# Patient Record
Sex: Female | Born: 1985 | Hispanic: No | Marital: Single | Smoking: Never smoker
Health system: Southern US, Community
[De-identification: ages and names within clinical notes are randomized; demographics above are authoritative.]

---

## 2019-09-10 NOTE — L&D Delivery Note (Addendum)
Roshunda Keir is a 34 y.o. female G3P2002 with IUP at [redacted]w[redacted]d admitted for IUFD.  She progressed with AROM and pitocin augmentation to complete and pushed 3.5 hours to deliver.    Delivery Note At 8:15 PM a stillborn female was delivered via Vaginal, Spontaneous (Presentation: ROA ).  APGAR: 0, 0; weight pending.   Placenta status: Spontaneous, Intact.  Cord: 3 vessels with the following complications: Velamentous.  Long slow delivery of head. With delivery of head, infant noted to be macrosomic and very swollen. Nuchal cord reduced. CNM unable to delivery shoulders and no movement of infant with maternal pushing effort. Dr. Earlene Plater called to bedside to assume care.   Anesthesia: Epidural Episiotomy: None Lacerations: 2nd degree Suture Repair: 3.0 vicryl Est. Blood Loss (mL):  200  Mom to postpartum.  Baby to [place].  Rolm Bookbinder CNM 02/13/2020, 8:40 PM   Called to bedside to assist with delivery for IUFD. On my arrival, fetal head delivered but CNM unable to deliver body. Fetal position OP. I was able to palpate the right axilla. I hooked a finger in the right axilla and tugged, was able to sweep arm caudally for delivery and in the process, shearing of the skin and dislocation of the fetal shoulder noted. This did not effect delivery of the infant, however, I was now able to palpate the left fetal axilla and performed the same maneuver, also shearing the fetal skin and dislocating the fetal axilla. With delivery of both shoulders, I was able to put pressure on the fetal trunk to slowly effect delivery. Once chest/abdomen were delivered, remainder of fetus delivered easily with a gush of dark red blood.This process took several minutes due to significant fetal size. Infant handed to waiting CNM, who clamped cord and delivered placenta.    Baldemar Lenis, M.D. Attending [facility] for [employer] [facility])

## 2020-02-02 ENCOUNTER — Inpatient Hospital Stay (HOSPITAL_COMMUNITY)
Admission: AD | Admit: 2020-02-02 | Discharge: 2020-02-03 | Disposition: A | Discharge status: Home / Self Care | Payer: Medicaid Other | Attending: Obstetrics & Gynecology | Admitting: Obstetrics & Gynecology

## 2020-02-02 ENCOUNTER — Other Ambulatory Visit: Payer: Self-pay

## 2020-02-02 ENCOUNTER — Inpatient Hospital Stay (HOSPITAL_BASED_OUTPATIENT_CLINIC_OR_DEPARTMENT_OTHER): Payer: Medicaid Other

## 2020-02-02 ENCOUNTER — Encounter (HOSPITAL_COMMUNITY): Payer: Self-pay | Admitting: Obstetrics & Gynecology

## 2020-02-02 DIAGNOSIS — Z3A35 35 weeks gestation of pregnancy: Secondary | ICD-10-CM

## 2020-02-02 DIAGNOSIS — O1493 Unspecified pre-eclampsia, third trimester: Secondary | ICD-10-CM

## 2020-02-02 DIAGNOSIS — O4703 False labor before 37 completed weeks of gestation, third trimester: Secondary | ICD-10-CM | POA: Insufficient documentation

## 2020-02-02 DIAGNOSIS — O288 Other abnormal findings on antenatal screening of mother: Secondary | ICD-10-CM | POA: Diagnosis not present

## 2020-02-02 LAB — DIFFERENTIAL
Abs Immature Granulocytes: 0.09 10*3/uL — ABNORMAL HIGH (ref 0.00–0.07)
Basophils Absolute: 0 10*3/uL (ref 0.0–0.1)
Basophils Relative: 0 %
Eosinophils Absolute: 0.2 10*3/uL (ref 0.0–0.5)
Eosinophils Relative: 2 %
Immature Granulocytes: 1 %
Lymphocytes Relative: 27 %
Lymphs Abs: 2.5 10*3/uL (ref 0.7–4.0)
Monocytes Absolute: 0.7 10*3/uL (ref 0.1–1.0)
Monocytes Relative: 7 %
Neutro Abs: 6.1 10*3/uL (ref 1.7–7.7)
Neutrophils Relative %: 63 %

## 2020-02-02 LAB — CBC
HCT: 37.2 % (ref 36.0–46.0)
Hemoglobin: 12.3 g/dL (ref 12.0–15.0)
MCH: 31.9 pg (ref 26.0–34.0)
MCHC: 33.1 g/dL (ref 30.0–36.0)
MCV: 96.6 fL (ref 80.0–100.0)
Platelets: 257 10*3/uL (ref 150–400)
RBC: 3.85 MIL/uL — ABNORMAL LOW (ref 3.87–5.11)
RDW: 13.4 % (ref 11.5–15.5)
WBC: 9.6 10*3/uL (ref 4.0–10.5)
nRBC: 0.5 % — ABNORMAL HIGH (ref 0.0–0.2)

## 2020-02-02 LAB — TYPE AND SCREEN
ABO/RH(D): A POS
Antibody Screen: NEGATIVE

## 2020-02-02 LAB — URINALYSIS, ROUTINE W REFLEX MICROSCOPIC
Bilirubin Urine: NEGATIVE
Glucose, UA: NEGATIVE mg/dL
Ketones, ur: NEGATIVE mg/dL
Nitrite: NEGATIVE
Protein, ur: NEGATIVE mg/dL
Specific Gravity, Urine: 1.005 (ref 1.005–1.030)
pH: 6 (ref 5.0–8.0)

## 2020-02-02 LAB — COMPREHENSIVE METABOLIC PANEL
ALT: 14 U/L (ref 0–44)
AST: 17 U/L (ref 15–41)
Albumin: 2.3 g/dL — ABNORMAL LOW (ref 3.5–5.0)
Alkaline Phosphatase: 80 U/L (ref 38–126)
Anion gap: 10 (ref 5–15)
BUN: 10 mg/dL (ref 6–20)
CO2: 18 mmol/L — ABNORMAL LOW (ref 22–32)
Calcium: 8.7 mg/dL — ABNORMAL LOW (ref 8.9–10.3)
Chloride: 104 mmol/L (ref 98–111)
Creatinine, Ser: 0.64 mg/dL (ref 0.44–1.00)
GFR calc Af Amer: >60 mL/min (ref >60)
GFR calc non Af Amer: >60 mL/min (ref >60)
Glucose, Bld: 159 mg/dL — ABNORMAL HIGH (ref 70–99)
Potassium: 4.1 mmol/L (ref 3.5–5.1)
Sodium: 132 mmol/L — ABNORMAL LOW (ref 135–145)
Total Bilirubin: 0.3 mg/dL (ref 0.3–1.2)
Total Protein: 6.4 g/dL — ABNORMAL LOW (ref 6.5–8.1)

## 2020-02-02 LAB — ABO/RH: ABO/RH(D): A POS

## 2020-02-02 NOTE — MAU Provider Note (Addendum)
CC:  Chief Complaint  Patient presents with  . Contractions     First Provider Initiated Contact with Patient 02/02/20 2140      HPI: Sonya Caldwell is a 34 y.o. year old G58P2002 female at [redacted]w[redacted]d weeks gestation who presents to MAU initially reporting contractions, but then said it was more pain in her pubic bone.  States she had a few prenatal visits in [place] and that she had an ultrasound in March where they gave her a due date 03/05/2020.  Does not have records and states she will not be able to access records.  Plans to stay and deliver in [place].  Does not have any plans for prenatal care.  Would like assistance with scheduling prenatal care.  Incidental finding of elevated blood pressure upon arrival to MAU.  States she had high blood pressure with her first pregnancy.  States she was not diagnosed with preeclampsia.  Associated Sx: Negative for headache, vision changes, epigastric pain, urinary complaints, GI complaints. Vaginal bleeding: Denies Leaking of fluid: Denies Fetal movement: Normal  O:  Patient Vitals for the past 24 hrs:  BP Temp Pulse Resp SpO2 Weight  02/03/20 0103 134/73 - 71 18 - -  02/03/20 0027 133/86 - 74 - - -  02/02/20 2246 (!) 117/103 - 70 - - -  02/02/20 2231 116/70 - 93 - - -  02/02/20 2218 (!) 130/101 - 66 - - -  02/02/20 2131 120/76 - 78 - - -  02/02/20 2122 (!) 145/64 - 84 - - -  02/02/20 2121 - 97.7 F (36.5 C) - 20 96 % 121.7 kg    General: NAD Heart: Regular rate Lungs: Normal rate and effort Abd: Soft, NT, Gravid, S>D, ~6 LB by Leopold's, possible elevated AFI? Extremities: 1+ pedal edema Pelvic: NEFG, no leaking of fluid or blood .  Tender symphysis pubis.  Dilation: Closed Exam by:: Dorathy Kinsman, CNMClear/thick/ballotable, vertex by Leopold's  EFM: 150, min-Moderate variability, no accelerations, no decelerations Toco: Uterine irritability  MAU course Orders Placed This Encounter  Procedures  . Culture, OB Urine  . [place]  MFM FETAL BPP WO NON STRESS  . Hepatitis B surface antigen  . Rubella screen  . RPR  . CBC  . Differential  . HIV Antibody (routine testing w rflx)  . Comprehensive metabolic panel  . Protein / creatinine ratio, urine  . Urinalysis, Routine w reflex microscopic  . Type and screen  . ABO/Rh  . Discharge patient   Results for orders placed or performed during the hospital encounter of 02/02/20 (from the past 24 hour(s))  CBC     Status: Abnormal   Collection Time: 02/02/20 10:05 PM  Result Value Ref Range   WBC 9.6 4.0 - 10.5 K/uL   RBC 3.85 (L) 3.87 - 5.11 MIL/uL   Hemoglobin 12.3 12.0 - 15.0 g/dL   HCT 62.9 52.8 - 41.3 %   MCV 96.6 80.0 - 100.0 fL   MCH 31.9 26.0 - 34.0 pg   MCHC 33.1 30.0 - 36.0 g/dL   RDW 24.4 01.0 - 27.2 %   Platelets 257 150 - 400 K/uL   nRBC 0.5 (H) 0.0 - 0.2 %  Differential     Status: Abnormal   Collection Time: 02/02/20 10:05 PM  Result Value Ref Range   Neutrophils Relative % 63 %   Neutro Abs 6.1 1.7 - 7.7 K/uL   Lymphocytes Relative 27 %   Lymphs Abs 2.5 0.7 - 4.0 K/uL   Monocytes  Relative 7 %   Monocytes Absolute 0.7 0.1 - 1.0 K/uL   Eosinophils Relative 2 %   Eosinophils Absolute 0.2 0.0 - 0.5 K/uL   Basophils Relative 0 %   Basophils Absolute 0.0 0.0 - 0.1 K/uL   Immature Granulocytes 1 %   Abs Immature Granulocytes 0.09 (H) 0.00 - 0.07 K/uL  Type and screen     Status: None   Collection Time: 02/02/20 10:05 PM  Result Value Ref Range   ABO/RH(D) A POS    Antibody Screen NEG    Sample Expiration      02/05/2020,2359 Performed at [facility], [address] [place], [place] [address]   Comprehensive metabolic panel     Status: Abnormal   Collection Time: 02/02/20 10:05 PM  Result Value Ref Range   Sodium 132 (L) 135 - 145 mmol/L   Potassium 4.1 3.5 - 5.1 mmol/L   Chloride 104 98 - 111 mmol/L   CO2 18 (L) 22 - 32 mmol/L   Glucose, Bld 159 (H) 70 - 99 mg/dL   BUN 10 6 - 20 mg/dL   Creatinine, Ser 3.55 0.44 - 1.00 mg/dL    Calcium 8.7 (L) 8.9 - 10.3 mg/dL   Total Protein 6.4 (L) 6.5 - 8.1 g/dL   Albumin 2.3 (L) 3.5 - 5.0 g/dL   AST 17 15 - 41 U/L   ALT 14 0 - 44 U/L   Alkaline Phosphatase 80 38 - 126 U/L   Total Bilirubin 0.3 0.3 - 1.2 mg/dL   GFR calc non Af Amer >60 >60 mL/min   GFR calc Af Amer >60 >60 mL/min   Anion gap 10 5 - 15  ABO/Rh     Status: None   Collection Time: 02/02/20 10:05 PM  Result Value Ref Range   ABO/RH(D)      A POS Performed at [facility], [address], [place], [place] [address]   Protein / creatinine ratio, urine     Status: Abnormal   Collection Time: 02/02/20 10:40 PM  Result Value Ref Range   Creatinine, Urine 28.64 mg/dL   Total Protein, Urine 12 mg/dL   Protein Creatinine Ratio 0.42 (H) 0.00 - 0.15 mg/mg[Cre]  Urinalysis, Routine w reflex microscopic     Status: Abnormal   Collection Time: 02/02/20 10:54 PM  Result Value Ref Range   Color, Urine STRAW (A) YELLOW   APPearance CLEAR CLEAR   Specific Gravity, Urine 1.005 1.005 - 1.030   pH 6.0 5.0 - 8.0   Glucose, UA NEGATIVE NEGATIVE mg/dL   Hgb urine dipstick MODERATE (A) NEGATIVE   Bilirubin Urine NEGATIVE NEGATIVE   Ketones, ur NEGATIVE NEGATIVE mg/dL   Protein, ur NEGATIVE NEGATIVE mg/dL   Nitrite NEGATIVE NEGATIVE   Leukocytes,Ua TRACE (A) NEGATIVE   RBC / HPF 0-5 0 - 5 RBC/hpf   WBC, UA 0-5 0 - 5 WBC/hpf   Bacteria, UA RARE (A) NONE SEEN   Squamous Epithelial / LPF 11-20 0 - 5   Mucus PRESENT     Care of patient turned over to Judeth Horn, NP at 10 PM.  Katrinka Blazing, [place], CNM 02/02/2020 10:07 PM   Elevated BPs in MAU.  No severe features.  Protein creatinine ratio elevated at 0.91 giving patient diagnosis of preeclampsia.  Patient is preterm, will get betamethasone series.  We will also send a message to the office for prenatal care and antenatal testing.  BPP ordered for nonreactive tracing.  Tracing became reactive  just prior to going to ultrasound.  BPP 8 out of 8, with NST 10 out  of 10.   A:  1. Pre-eclampsia in third trimester   2. Non-reactive NST (non-stress test)   3. [redacted] weeks gestation of pregnancy    P: Discharge home Return to MAU tomorrow for second BMZ Preeclampsia precautions Msg to [facility] for ob appts  Jorje Guild, NP

## 2020-02-02 NOTE — MAU Note (Signed)
Pt reports recently moving to here from [place].   Pt reports last prenatal visit March 17.   Pt reports not getting prenatal care since being in [place].   Denies vaginal bleeding or LOF.   Reports +FM    Pt reports starting to have ctx's at 1200.

## 2020-02-03 ENCOUNTER — Encounter (HOSPITAL_COMMUNITY): Payer: Self-pay | Admitting: *Deleted

## 2020-02-03 ENCOUNTER — Inpatient Hospital Stay (HOSPITAL_COMMUNITY)
Admission: AD | Admit: 2020-02-03 | Discharge: 2020-02-03 | Disposition: A | Discharge status: Home / Self Care | Payer: Medicaid Other | Source: Home / Self Care | Attending: Obstetrics & Gynecology | Admitting: Obstetrics & Gynecology

## 2020-02-03 DIAGNOSIS — Z3A35 35 weeks gestation of pregnancy: Secondary | ICD-10-CM

## 2020-02-03 DIAGNOSIS — O1493 Unspecified pre-eclampsia, third trimester: Secondary | ICD-10-CM

## 2020-02-03 LAB — HIV ANTIBODY (ROUTINE TESTING W REFLEX): HIV Screen 4th Generation wRfx: NONREACTIVE

## 2020-02-03 LAB — HEPATITIS B SURFACE ANTIGEN: Hepatitis B Surface Ag: NONREACTIVE

## 2020-02-03 LAB — PROTEIN / CREATININE RATIO, URINE
Creatinine, Urine: 28.64 mg/dL
Protein Creatinine Ratio: 0.42 mg/mg{Cre} — ABNORMAL HIGH (ref 0.00–0.15)
Total Protein, Urine: 12 mg/dL

## 2020-02-03 LAB — RPR: RPR Ser Ql: NONREACTIVE

## 2020-02-03 MED ORDER — BETAMETHASONE SOD PHOS & ACET 6 (3-3) MG/ML IJ SUSP
12.0000 mg | Freq: Once | INTRAMUSCULAR | Status: AC
  Administered 2020-02-03: 12 mg via INTRAMUSCULAR
  Filled 2020-02-03: qty 5

## 2020-02-03 MED ORDER — BETAMETHASONE SOD PHOS & ACET 6 (3-3) MG/ML IJ SUSP
12.0000 mg | Freq: Once | INTRAMUSCULAR | Status: AC
  Administered 2020-02-03: 12 mg via INTRAMUSCULAR

## 2020-02-03 NOTE — MAU Note (Signed)
Pt here for 2nd BMZ injection. STates swelling has gone down and feels much better today. Denies VB or LOF. Good FM. Steroid injection given at 2100 and pt tol well.

## 2020-02-03 NOTE — Discharge Instructions (Signed)
Hypertension During Pregnancy °High blood pressure (hypertension) is when the force of blood pumping through the arteries is too strong. Arteries are blood vessels that carry blood from the heart throughout the body. Hypertension during pregnancy can be mild or severe. Severe hypertension during pregnancy (preeclampsia) is a medical emergency that requires prompt evaluation and treatment. °Different types of hypertension can happen during pregnancy. These include: °· Chronic hypertension. This happens when you had high blood pressure before you became pregnant, and it continues during the pregnancy. Hypertension that develops before you are [redacted] weeks pregnant and continues during the pregnancy is also called chronic hypertension. If you have chronic hypertension, it will not go away after you have your baby. You will need follow-up visits with your health care provider after you have your baby. Your doctor may want you to keep taking medicine for your blood pressure. °· Gestational hypertension. This is hypertension that develops after the 20th week of pregnancy. Gestational hypertension usually goes away after you have your baby, but your health care provider will need to monitor your blood pressure to make sure that it is getting better. °· Preeclampsia. This is severe hypertension during pregnancy. This can cause serious complications for you and your baby and can also cause complications for you after the delivery of your baby. °· Postpartum preeclampsia. You may develop severe hypertension after giving birth. This usually occurs within 48 hours after childbirth but may occur up to 6 weeks after giving birth. This is rare. °How does this affect me? °Women who have hypertension during pregnancy have a greater chance of developing hypertension later in life or during future pregnancies. In some cases, hypertension during pregnancy can cause serious complications, such as: °· Stroke. °· Heart attack. °· Injury to  other organs, such as kidneys, lungs, or liver. °· Preeclampsia. °· Convulsions or seizures. °· Placental abruption. °How does this affect my baby? °Hypertension during pregnancy can affect your baby. Your baby may: °· Be born early (prematurely). °· Not weigh as much as he or she should at birth (low birth weight). °· Not tolerate labor well, leading to an unplanned cesarean delivery. °What are the risks? °There are certain factors that make it more likely for you to develop hypertension during pregnancy. These include: °· Having hypertension during a previous pregnancy. °· Being overweight. °· Being age 35 or older. °· Being pregnant for the first time. °· Being pregnant with more than one baby. °· Becoming pregnant using fertilization methods, such as IVF (in vitro fertilization). °· Having other medical problems, such as diabetes, kidney disease, or lupus. °· Having a family history of hypertension. °What can I do to lower my risk? °The exact cause of hypertension during pregnancy is not known. You may be able to lower your risk by: °· Maintaining a healthy weight. °· Eating a healthy and balanced diet. °· Following your health care provider's instructions about treating any long-term conditions that you had before becoming pregnant. °It is very important to keep all of your prenatal care appointments. Your health care provider will check your blood pressure and make sure that your pregnancy is progressing as expected. If a problem is found, early treatment can prevent complications. °How is this treated? °Treatment for hypertension during pregnancy varies depending on the type of hypertension you have and how serious it is. °· If you were taking medicine for high blood pressure before you became pregnant, talk with your health care provider. You may need to change medicine during pregnancy because   some medicines, like ACE inhibitors, may not be considered safe for your baby. °· If you have gestational  hypertension, your health care provider may order medicine to treat this during pregnancy. °· If you are at risk for preeclampsia, your health care provider may recommend that you take a low-dose aspirin during your pregnancy. °· If you have severe hypertension, you may need to be hospitalized so you and your baby can be monitored closely. You may also need to be given medicine to lower your blood pressure. This medicine may be given by mouth or through an IV. °· In some cases, if your condition gets worse, you may need to deliver your baby early. °Follow these instructions at home: °Eating and drinking ° °· Drink enough fluid to keep your urine pale yellow. °· Avoid caffeine. °Lifestyle °· Do not use any products that contain nicotine or tobacco, such as cigarettes, e-cigarettes, and chewing tobacco. If you need help quitting, ask your health care provider. °· Do not use alcohol or drugs. °· Avoid stress as much as possible. °· Rest and get plenty of sleep. °· Regular exercise can help to reduce your blood pressure. Ask your health care provider what kinds of exercise are best for you. °General instructions °· Take over-the-counter and prescription medicines only as told by your health care provider. °· Keep all prenatal and follow-up visits as told by your health care provider. This is important. °Contact a health care provider if: °· You have symptoms that your health care provider told you may require more treatment or monitoring, such as: °? Headaches. °? Nausea or vomiting. °? Abdominal pain. °? Dizziness. °? Light-headedness. °Get help right away if: °· You have: °? Severe abdominal pain that does not get better with treatment. °? A severe headache that does not get better. °? Vomiting that does not get better. °? Sudden, rapid weight gain. °? Sudden swelling in your hands, ankles, or face. °? Vaginal bleeding. °? Blood in your urine. °? Blurred or double vision. °? Shortness of breath or chest  pain. °? Weakness on one side of your body. °? Difficulty speaking. °· Your baby is not moving as much as usual. °Summary °· High blood pressure (hypertension) is when the force of blood pumping through the arteries is too strong. °· Hypertension during pregnancy can cause problems for you and your baby. °· Treatment for hypertension during pregnancy varies depending on the type of hypertension you have and how serious it is. °· Keep all prenatal and follow-up visits as told by your health care provider. This is important. °This information is not intended to replace advice given to you by your health care provider. Make sure you discuss any questions you have with your health care provider. °Document Revised: 12/17/2018 Document Reviewed: 09/22/2018 °Elsevier Patient Education © 2020 [employer] ° ° ° ° °Fetal Movement Counts °Patient Name: ________________________________________________ Patient Due Date: ____________________ °What is a fetal movement count? ° °A fetal movement count is the number of times that you feel your baby move during a certain amount of time. This may also be called a fetal kick count. A fetal movement count is recommended for every pregnant woman. You may be asked to start counting fetal movements as early as week 28 of your pregnancy. °Pay attention to when your baby is most active. You may notice your baby's sleep and wake cycles. You may also notice things that make your baby move more. You should do a fetal movement count: °· When   your baby is normally most active. °· At the same time each day. °A good time to count movements is while you are resting, after having something to eat and drink. °How do I count fetal movements? °1. Find a quiet, comfortable area. Sit, or lie down on your side. °2. Write down the date, the start time and stop time, and the number of movements that you felt between those two times. Take this information with you to your health care visits. °3. Write down  your start time when you feel the first movement. °4. Count kicks, flutters, swishes, rolls, and jabs. You should feel at least 10 movements. °5. You may stop counting after you have felt 10 movements, or if you have been counting for 2 hours. Write down the stop time. °6. If you do not feel 10 movements in 2 hours, contact your health care provider for further instructions. Your health care provider may want to do additional tests to assess your baby's well-being. °Contact a health care provider if: °· You feel fewer than 10 movements in 2 hours. °· Your baby is not moving like he or she usually does. °Date: ____________ Start time: ____________ Stop time: ____________ Movements: ____________ °Date: ____________ Start time: ____________ Stop time: ____________ Movements: ____________ °Date: ____________ Start time: ____________ Stop time: ____________ Movements: ____________ °Date: ____________ Start time: ____________ Stop time: ____________ Movements: ____________ °Date: ____________ Start time: ____________ Stop time: ____________ Movements: ____________ °Date: ____________ Start time: ____________ Stop time: ____________ Movements: ____________ °Date: ____________ Start time: ____________ Stop time: ____________ Movements: ____________ °Date: ____________ Start time: ____________ Stop time: ____________ Movements: ____________ °Date: ____________ Start time: ____________ Stop time: ____________ Movements: ____________ °This information is not intended to replace advice given to you by your health care provider. Make sure you discuss any questions you have with your health care provider. °Document Revised: 04/15/2019 Document Reviewed: 04/15/2019 °Elsevier Patient Education © 2020 Elsevier Inc. ° °

## 2020-02-03 NOTE — Progress Notes (Signed)
Written and verbal d/c instructions given and understanding voiced. Understands will be before d/c after receiving BMZ. Will return tomorrow night around 2000 for second BMZ.

## 2020-02-04 LAB — CULTURE, OB URINE

## 2020-02-04 LAB — RUBELLA SCREEN: Rubella: 1.27 index (ref >0.99)

## 2020-02-09 ENCOUNTER — Telehealth: Payer: Self-pay | Admitting: Family Medicine

## 2020-02-09 NOTE — Telephone Encounter (Signed)
Attempted to contact patient to get her scheduled for prenatal care at our office. No answer, left voicemail for patient to give the office a call back to be scheduled.  

## 2020-02-13 ENCOUNTER — Inpatient Hospital Stay (HOSPITAL_BASED_OUTPATIENT_CLINIC_OR_DEPARTMENT_OTHER): Payer: Medicaid Other

## 2020-02-13 ENCOUNTER — Encounter (HOSPITAL_COMMUNITY): Payer: Self-pay | Admitting: Obstetrics and Gynecology

## 2020-02-13 ENCOUNTER — Inpatient Hospital Stay (HOSPITAL_COMMUNITY): Payer: Medicaid Other | Admitting: Anesthesiology

## 2020-02-13 ENCOUNTER — Inpatient Hospital Stay (HOSPITAL_COMMUNITY)
Admission: AD | Admit: 2020-02-13 | Discharge: 2020-02-14 | DRG: 807 | Disposition: A | Discharge status: Home / Self Care | Payer: Medicaid Other | Attending: Obstetrics and Gynecology | Admitting: Obstetrics and Gynecology

## 2020-02-13 ENCOUNTER — Other Ambulatory Visit: Payer: Self-pay

## 2020-02-13 DIAGNOSIS — Z3A37 37 weeks gestation of pregnancy: Secondary | ICD-10-CM

## 2020-02-13 DIAGNOSIS — O43123 Velamentous insertion of umbilical cord, third trimester: Secondary | ICD-10-CM | POA: Diagnosis present

## 2020-02-13 DIAGNOSIS — O1404 Mild to moderate pre-eclampsia, complicating childbirth: Secondary | ICD-10-CM | POA: Diagnosis present

## 2020-02-13 DIAGNOSIS — O364XX0 Maternal care for intrauterine death, not applicable or unspecified: Secondary | ICD-10-CM | POA: Diagnosis present

## 2020-02-13 DIAGNOSIS — O3663X0 Maternal care for excessive fetal growth, third trimester, not applicable or unspecified: Secondary | ICD-10-CM | POA: Diagnosis present

## 2020-02-13 DIAGNOSIS — Z20822 Contact with and (suspected) exposure to covid-19: Secondary | ICD-10-CM | POA: Diagnosis present

## 2020-02-13 LAB — DIC (DISSEMINATED INTRAVASCULAR COAGULATION)PANEL
D-Dimer, Quant: 1.27 ug/mL-FEU — ABNORMAL HIGH (ref 0.00–0.50)
D-Dimer, Quant: 2.21 ug/mL-FEU — ABNORMAL HIGH (ref 0.00–0.50)
Fibrinogen: 674 mg/dL — ABNORMAL HIGH (ref 210–475)
Fibrinogen: 650 mg/dL — ABNORMAL HIGH (ref 210–475)
INR: 1 (ref 0.8–1.2)
INR: 1.1 (ref 0.8–1.2)
Platelets: 285 10*3/uL (ref 150–400)
Platelets: 267 10*3/uL (ref 150–400)
Prothrombin Time: 13.1 seconds (ref 11.4–15.2)
Prothrombin Time: 13.4 seconds (ref 11.4–15.2)
Smear Review: NONE SEEN
Smear Review: NONE SEEN
aPTT: 28 seconds (ref 24–36)
aPTT: 26 seconds (ref 24–36)

## 2020-02-13 LAB — COMPREHENSIVE METABOLIC PANEL
ALT: 17 U/L (ref 0–44)
AST: 20 U/L (ref 15–41)
Albumin: 2.5 g/dL — ABNORMAL LOW (ref 3.5–5.0)
Alkaline Phosphatase: 85 U/L (ref 38–126)
Anion gap: 12 (ref 5–15)
BUN: 9 mg/dL (ref 6–20)
CO2: 22 mmol/L (ref 22–32)
Calcium: 9.4 mg/dL (ref 8.9–10.3)
Chloride: 100 mmol/L (ref 98–111)
Creatinine, Ser: 0.55 mg/dL (ref 0.44–1.00)
GFR calc Af Amer: >60 mL/min (ref >60)
GFR calc non Af Amer: >60 mL/min (ref >60)
Glucose, Bld: 157 mg/dL — ABNORMAL HIGH (ref 70–99)
Potassium: 4 mmol/L (ref 3.5–5.1)
Sodium: 134 mmol/L — ABNORMAL LOW (ref 135–145)
Total Bilirubin: <0.1 mg/dL — ABNORMAL LOW (ref 0.3–1.2)
Total Protein: 6.8 g/dL (ref 6.5–8.1)

## 2020-02-13 LAB — CBC
HCT: 41.5 % (ref 36.0–46.0)
Hemoglobin: 13.6 g/dL (ref 12.0–15.0)
MCH: 31.8 pg (ref 26.0–34.0)
MCHC: 32.8 g/dL (ref 30.0–36.0)
MCV: 97 fL (ref 80.0–100.0)
Platelets: 282 10*3/uL (ref 150–400)
RBC: 4.28 MIL/uL (ref 3.87–5.11)
RDW: 13.5 % (ref 11.5–15.5)
WBC: 11.2 10*3/uL — ABNORMAL HIGH (ref 4.0–10.5)
nRBC: 0 % (ref 0.0–0.2)

## 2020-02-13 LAB — TYPE AND SCREEN
ABO/RH(D): A POS
Antibody Screen: NEGATIVE

## 2020-02-13 LAB — RAPID URINE DRUG SCREEN, HOSP PERFORMED
Amphetamines: NOT DETECTED
Barbiturates: NOT DETECTED
Benzodiazepines: NOT DETECTED
Cocaine: NOT DETECTED
Opiates: NOT DETECTED
Tetrahydrocannabinol: NOT DETECTED

## 2020-02-13 LAB — PROTEIN / CREATININE RATIO, URINE
Creatinine, Urine: 22.31 mg/dL
Protein Creatinine Ratio: 0.81 mg/mg{Cre} — ABNORMAL HIGH (ref 0.00–0.15)
Total Protein, Urine: 18 mg/dL

## 2020-02-13 LAB — SARS CORONAVIRUS 2 BY RT PCR (HOSPITAL ORDER, PERFORMED IN ~~LOC~~ HOSPITAL LAB): SARS Coronavirus 2: NEGATIVE

## 2020-02-13 LAB — RPR: RPR Ser Ql: NONREACTIVE

## 2020-02-13 MED ORDER — ONDANSETRON HCL 4 MG/2ML IJ SOLN: 4.0000 mg | INTRAMUSCULAR | Status: DC | PRN

## 2020-02-13 MED ORDER — SODIUM CHLORIDE (PF) 0.9 % IJ SOLN
INTRAMUSCULAR | Status: DC | PRN
  Administered 2020-02-13: 12 mL/h via EPIDURAL

## 2020-02-13 MED ORDER — ONDANSETRON HCL 4 MG PO TABS: 4.0000 mg | ORAL_TABLET | ORAL | Status: DC | PRN

## 2020-02-13 MED ORDER — SOD CITRATE-CITRIC ACID 500-334 MG/5ML PO SOLN: 30.0000 mL | ORAL | Status: DC | PRN

## 2020-02-13 MED ORDER — COCONUT OIL OIL: 1.0000 "application " | TOPICAL_OIL | Status: DC | PRN

## 2020-02-13 MED ORDER — TRANEXAMIC ACID-NACL 1000-0.7 MG/100ML-% IV SOLN: 1000.0000 mg | INTRAVENOUS | Status: DC

## 2020-02-13 MED ORDER — EPHEDRINE 5 MG/ML INJ: 10.0000 mg | INTRAVENOUS | Status: DC | PRN

## 2020-02-13 MED ORDER — OXYTOCIN-SODIUM CHLORIDE 30-0.9 UT/500ML-% IV SOLN
1.0000 m[IU]/min | INTRAVENOUS | Status: DC
  Administered 2020-02-13: 2 m[IU]/min via INTRAVENOUS

## 2020-02-13 MED ORDER — LIDOCAINE HCL (PF) 1 % IJ SOLN: 30.0000 mL | INTRAMUSCULAR | Status: DC | PRN

## 2020-02-13 MED ORDER — LACTATED RINGERS IV SOLN: INTRAVENOUS | Status: DC

## 2020-02-13 MED ORDER — IBUPROFEN 600 MG PO TABS
600.0000 mg | ORAL_TABLET | Freq: Four times a day (QID) | ORAL | Status: DC
  Administered 2020-02-14 (×3): 600 mg via ORAL
  Filled 2020-02-13 (×3): qty 1

## 2020-02-13 MED ORDER — FENTANYL-BUPIVACAINE-NACL 0.5-0.125-0.9 MG/250ML-% EP SOLN: 12.0000 mL/h | EPIDURAL | Status: DC | PRN

## 2020-02-13 MED ORDER — TRANEXAMIC ACID-NACL 1000-0.7 MG/100ML-% IV SOLN
INTRAVENOUS | Status: AC
  Administered 2020-02-13: 1000 mg
  Filled 2020-02-13: qty 100

## 2020-02-13 MED ORDER — DIBUCAINE (PERIANAL) 1 % EX OINT: 1.0000 "application " | TOPICAL_OINTMENT | CUTANEOUS | Status: DC | PRN

## 2020-02-13 MED ORDER — ACETAMINOPHEN 325 MG PO TABS: 650.0000 mg | ORAL_TABLET | ORAL | Status: DC | PRN

## 2020-02-13 MED ORDER — ONDANSETRON HCL 4 MG/2ML IJ SOLN: 4.0000 mg | Freq: Four times a day (QID) | INTRAMUSCULAR | Status: DC | PRN

## 2020-02-13 MED ORDER — PRENATAL MULTIVITAMIN CH
1.0000 | ORAL_TABLET | Freq: Every day | ORAL | Status: DC
  Administered 2020-02-14: 1 via ORAL
  Filled 2020-02-13: qty 1

## 2020-02-13 MED ORDER — BENZOCAINE-MENTHOL 20-0.5 % EX AERO
1.0000 "application " | INHALATION_SPRAY | CUTANEOUS | Status: DC | PRN
  Filled 2020-02-13 (×2): qty 56

## 2020-02-13 MED ORDER — OXYCODONE-ACETAMINOPHEN 5-325 MG PO TABS: 1.0000 | ORAL_TABLET | ORAL | Status: DC | PRN

## 2020-02-13 MED ORDER — LACTATED RINGERS IV SOLN: 500.0000 mL | INTRAVENOUS | Status: DC | PRN

## 2020-02-13 MED ORDER — WITCH HAZEL-GLYCERIN EX PADS: 1.0000 "application " | MEDICATED_PAD | CUTANEOUS | Status: DC | PRN

## 2020-02-13 MED ORDER — FENTANYL-BUPIVACAINE-NACL 0.5-0.125-0.9 MG/250ML-% EP SOLN
EPIDURAL | Status: AC
  Filled 2020-02-13: qty 250

## 2020-02-13 MED ORDER — OXYTOCIN-SODIUM CHLORIDE 30-0.9 UT/500ML-% IV SOLN
2.5000 [IU]/h | INTRAVENOUS | Status: DC
  Filled 2020-02-13: qty 1000

## 2020-02-13 MED ORDER — SENNOSIDES-DOCUSATE SODIUM 8.6-50 MG PO TABS
2.0000 | ORAL_TABLET | ORAL | Status: DC
  Administered 2020-02-14: 2 via ORAL
  Filled 2020-02-13: qty 2

## 2020-02-13 MED ORDER — LIDOCAINE HCL (PF) 1 % IJ SOLN
INTRAMUSCULAR | Status: DC | PRN
  Administered 2020-02-13: 6 mL via EPIDURAL

## 2020-02-13 MED ORDER — TETANUS-DIPHTH-ACELL PERTUSSIS 5-2.5-18.5 LF-MCG/0.5 IM SUSP: 0.5000 mL | Freq: Once | INTRAMUSCULAR | Status: DC

## 2020-02-13 MED ORDER — DIPHENHYDRAMINE HCL 50 MG/ML IJ SOLN: 12.5000 mg | INTRAMUSCULAR | Status: DC | PRN

## 2020-02-13 MED ORDER — OXYTOCIN BOLUS FROM INFUSION
500.0000 mL | Freq: Once | INTRAVENOUS | Status: DC
  Administered 2020-02-13: 500 mL via INTRAVENOUS

## 2020-02-13 MED ORDER — LACTATED RINGERS IV SOLN
500.0000 mL | Freq: Once | INTRAVENOUS | Status: AC
  Administered 2020-02-13: 500 mL via INTRAVENOUS

## 2020-02-13 MED ORDER — ZOLPIDEM TARTRATE 5 MG PO TABS: 5.0000 mg | ORAL_TABLET | Freq: Every evening | ORAL | Status: DC | PRN

## 2020-02-13 MED ORDER — DIPHENHYDRAMINE HCL 25 MG PO CAPS: 25.0000 mg | ORAL_CAPSULE | Freq: Four times a day (QID) | ORAL | Status: DC | PRN

## 2020-02-13 MED ORDER — SIMETHICONE 80 MG PO CHEW: 80.0000 mg | CHEWABLE_TABLET | ORAL | Status: DC | PRN

## 2020-02-13 MED ORDER — PHENYLEPHRINE 40 MCG/ML (10ML) SYRINGE FOR IV PUSH (FOR BLOOD PRESSURE SUPPORT)
80.0000 ug | PREFILLED_SYRINGE | INTRAVENOUS | Status: DC | PRN
  Filled 2020-02-13: qty 10

## 2020-02-13 MED ORDER — OXYCODONE-ACETAMINOPHEN 5-325 MG PO TABS: 2.0000 | ORAL_TABLET | ORAL | Status: DC | PRN

## 2020-02-13 MED ORDER — FLEET ENEMA 7-19 GM/118ML RE ENEM: 1.0000 | ENEMA | RECTAL | Status: DC | PRN

## 2020-02-13 MED ORDER — FENTANYL CITRATE (PF) 100 MCG/2ML IJ SOLN: 100.0000 ug | INTRAMUSCULAR | Status: DC | PRN

## 2020-02-13 MED ORDER — PHENYLEPHRINE 40 MCG/ML (10ML) SYRINGE FOR IV PUSH (FOR BLOOD PRESSURE SUPPORT): 80.0000 ug | PREFILLED_SYRINGE | INTRAVENOUS | Status: DC | PRN

## 2020-02-13 NOTE — Discharge Summary (Signed)
Postpartum Discharge Summary       Patient Name: Sonya Caldwell DOB: 1986/04/13 MRN: [id number]  Date of admission: 02/13/2020 Delivery date:02/13/2020  Delivering provider: Conan Bowens  Date of discharge: 02/14/2020  Admitting diagnosis: IUFD at 20 weeks or more of gestation [O36.4XX0] Intrauterine pregnancy: [redacted]w[redacted]d     Secondary diagnosis:  Active Problems:   IUFD at 20 weeks or more of gestation  Additional problems: shoulder dystocia    Discharge diagnosis: Term Pregnancy Delivered and Preeclampsia (mild)                                              Post partum procedures: none Augmentation: AROM and Pitocin Complications: [facility] course: Onset of Labor With Vaginal Delivery      34 y.o. yo G3P2002 at [redacted]w[redacted]d was admitted in Active Labor on 02/13/2020. Patient was found to have no fetal heart tones on admission. AROM and pitocin augmentation used Membrane Rupture Time/Date: 2:30 PM ,02/13/2020   Delivery Method:Vaginal, Spontaneous  Episiotomy: None  Lacerations:  2nd degree  Patient had an uncomplicated postpartum course.  Patient with elevated BP 140/90's without symptoms. Patient started on enalapril. She is ambulating, tolerating a regular diet, passing flatus, and urinating well. Patient requesting to be discharge home. She reports having a good support system at home. She agrees to following up in the office for BP check. Discharge precautions reviewed with the patient. Patient is discharged home in stable condition on 02/14/20.  Newborn Data: Birth date:02/13/2020  Birth time:8:15 PM  Gender:Female  Living status:Fetal Demise  Apgars: ,  Weight:5055 g   Magnesium Sulfate received: No BMZ received: Yes Rhophylac:N/A   Physical exam  Vitals:   02/13/20 2200 02/13/20 2300 02/14/20 0456 02/14/20 1120  BP: 133/81 (!) 144/84 (!) 148/93 130/69  Pulse: (!) 119 (!) 114 (!) 104 (!) 123  Resp: 18 18 18 18   Temp:  98.3 F (36.8 C) 99.1 F (37.3 C) 99.2 F  (37.3 C)  TempSrc:  Oral Oral Oral  SpO2:  99% 99% 96%  Weight:      Height:       General: alert, cooperative and no distress Lochia: appropriate Uterine Fundus: firm DVT Evaluation: No evidence of DVT seen on physical exam. Labs: Lab Results  Component Value Date   WBC 17.8 (H) 02/14/2020   HGB 12.4 02/14/2020   HCT 37.9 02/14/2020   MCV 98.2 02/14/2020   PLT 228 02/14/2020   CMP Latest Ref Rng & Units 02/14/2020  Glucose 70 - 99 mg/dL 04/15/2020)  BUN 6 - 20 mg/dL 6  Creatinine 518(A - 4.16 mg/dL 6.06  Sodium 3.01 - 601 mmol/L 136  Potassium 3.5 - 5.1 mmol/L 4.0  Chloride 98 - 111 mmol/L 102  CO2 22 - 32 mmol/L 22  Calcium 8.9 - 10.3 mg/dL 093)  Total Protein 6.5 - 8.1 g/dL 6.1(L)  Total Bilirubin 0.3 - 1.2 mg/dL 0.5  Alkaline Phos 38 - 126 U/L 83  AST 15 - 41 U/L 27  ALT 0 - 44 U/L 17   Edinburgh Score: No flowsheet data found.   After visit meds:  Allergies as of 02/14/2020   No Known Allergies     Medication List    TAKE these medications   enalapril 10 MG tablet Commonly known as: VASOTEC Take 1 tablet (10 mg total) by mouth  daily. Start taking on: February 15, 2020   ibuprofen 600 MG tablet Commonly known as: ADVIL Take 1 tablet (600 mg total) by mouth every 6 (six) hours.   multivitamin-prenatal 27-0.8 MG Tabs tablet Take 1 tablet by mouth daily at 12 noon.        Discharge home in stable condition Infant [place] Discharge instruction: per After Visit Summary and Postpartum booklet. Activity: Advance as tolerated. Pelvic rest for 6 weeks.  Diet: routine diet Future Appointments: Future Appointments  Date Time Provider [place]  02/22/2020 10:20 AM [facility] NURSE [facility]  02/29/2020  1:45 PM [facility]  03/20/2020  9:35 AM Griffin Basil, MD [facility]   Follow up Visit:   Please schedule this patient for a In person postpartum visit in 2-3 days with the following provider: MD. Additional  Postpartum F/U:BP check 2-3 days  High risk pregnancy complicated by: HTN Delivery mode:  Vaginal, Spontaneous  Anticipated Birth Control:  Unsure

## 2020-02-13 NOTE — MAU Note (Signed)
Sonya Caldwell is a 34 y.o. at [redacted]w[redacted]d here in MAU reporting: started having contractions last night, they are now less than 5 min apart. Having some bloody show. No LOF. +FM  Onset of complaint: last night  Pain score: 5/10  Vitals:   02/13/20 0921  BP: (!) 134/98  Pulse: (!) 124  Resp: 16  Temp: 97.8 F (36.6 C)  SpO2: 98%     FHT: +FM  Lab orders placed from triage: none, urine sample collected

## 2020-02-13 NOTE — H&P (Signed)
OBSTETRIC ADMISSION HISTORY AND PHYSICAL  Sonya Caldwell is a 34 y.o. female G3P2002 with IUP at [redacted]w[redacted]d by LMP presenting for IOL for IUFD. She recently moved to [facility] from [place]. She reports prenatal care in [place] up until March but none since then. She denies any problems in the pregnancy. She was seen in MAU on 5/26 and diagnosed with preeclampsia and received BMZ. MCW attempted to reach her to establish prenatal care and was unable to get in contact with her. She came to MAU today for a labor check and was found to have no FHT. She is unsure of the last time she felt the baby move. She reports some bloody show and intermittent contractions.   She reports +FMs, No LOF, no blurry vision, headaches or peripheral edema, and RUQ pain.  She is unsure what she wants for birth control.   Dating: By LMP --->  Estimated Date of Delivery: 03/05/20  Past Medical History: History reviewed. No pertinent past medical history.  Past Surgical History: History reviewed. No pertinent surgical history.  Obstetrical History: OB History    Gravida  3   Para  2   Term  2   Preterm      AB      Living  2     SAB      TAB      Ectopic      Multiple      Live Births  2           Social History Social History   Socioeconomic History  . Marital status: Single    Spouse name: Not on file  . Number of children: Not on file  . Years of education: Not on file  . Highest education level: Not on file  Occupational History  . Not on file  Tobacco Use  . Smoking status: Never Smoker  . Smokeless tobacco: Never Used  Substance and Sexual Activity  . Alcohol use: Never  . Drug use: Never  . Sexual activity: Not Currently  Other Topics Concern  . Not on file  Social History Narrative  . Not on file   Social Determinants of Health   Financial Resource Strain:   . Difficulty of Paying Living Expenses:   Food Insecurity:   . Worried About Programme researcher, broadcasting/film/video in the Last Year:   .  Barista in the Last Year:   Transportation Needs:   . Freight forwarder (Medical):   Marland Kitchen Lack of Transportation (Non-Medical):   Physical Activity:   . Days of Exercise per Week:   . Minutes of Exercise per Session:   Stress:   . Feeling of Stress :   Social Connections:   . Frequency of Communication with Friends and Family:   . Frequency of Social Gatherings with Friends and Family:   . Attends Religious Services:   . Active Member of Clubs or Organizations:   . Attends Banker Meetings:   Marland Kitchen Marital Status:     Family History: History reviewed. No pertinent family history.  Allergies: No Known Allergies  Medications Prior to Admission  Medication Sig Dispense Refill Last Dose  . Prenatal Vit-Fe Fumarate-FA (MULTIVITAMIN-PRENATAL) 27-0.8 MG TABS tablet Take 1 tablet by mouth daily at 12 noon.   02/13/2020 at Unknown time     Review of Systems   All systems reviewed and negative except as stated in HPI  Blood pressure 131/83, pulse (!) 106, temperature 97.8 F (36.6  C), temperature source Oral, resp. rate 20, height 5\' 2"  (1.575 m), weight 117.9 kg, SpO2 98 %. General appearance: alert, cooperative and no distress Lungs: clear to auscultation bilaterally Heart: regular rate and rhythm Abdomen: soft, non-tender; bowel sounds normal Pelvic: n/a Extremities: Homans sign is negative, no sign of DVT DTR's +2 Presentation: cephalic   Cervix: 5/90/-3  Prenatal labs: ABO, Rh: --/--/A POS (06/06 1025) Antibody: NEG (06/06 1025) Rubella: 1.27 (05/26 2205) RPR: NON REACTIVE (05/26 2205)  HBsAg: NON REACTIVE (05/26 2205)  HIV: Non Reactive (05/26 2205)  GBS:   unknown Prenatal Transfer Tool  Maternal Diabetes: No Genetic Screening: Normal per patient report Maternal Ultrasounds/Referrals: Other:no fetal heart tones Fetal Ultrasounds or other Referrals:  None Maternal Substance Abuse:  No Significant Maternal Medications:  None Significant  Maternal Lab Results: None  Results for orders placed or performed during the hospital encounter of 02/13/20 (from the past 24 hour(s))  DIC panel   Collection Time: 02/13/20 10:22 AM  Result Value Ref Range   Prothrombin Time 13.1 11.4 - 15.2 seconds   INR 1.0 0.8 - 1.2   aPTT 28 24 - 36 seconds   Fibrinogen 674 (H) 210 - 475 mg/dL   D-Dimer, Quant 04/14/20 (H) 0.00 - 0.50 ug/mL-FEU   Platelets 285 150 - 400 K/uL   Smear Review NO SCHISTOCYTES SEEN   CBC   Collection Time: 02/13/20 10:22 AM  Result Value Ref Range   WBC 11.2 (H) 4.0 - 10.5 K/uL   RBC 4.28 3.87 - 5.11 MIL/uL   Hemoglobin 13.6 12.0 - 15.0 g/dL   HCT 04/14/20 16.0 - 10.9 %   MCV 97.0 80.0 - 100.0 fL   MCH 31.8 26.0 - 34.0 pg   MCHC 32.8 30.0 - 36.0 g/dL   RDW 32.3 55.7 - 32.2 %   Platelets 282 150 - 400 K/uL   nRBC 0.0 0.0 - 0.2 %  Comprehensive metabolic panel   Collection Time: 02/13/20 10:22 AM  Result Value Ref Range   Sodium 134 (L) 135 - 145 mmol/L   Potassium 4.0 3.5 - 5.1 mmol/L   Chloride 100 98 - 111 mmol/L   CO2 22 22 - 32 mmol/L   Glucose, Bld 157 (H) 70 - 99 mg/dL   BUN 9 6 - 20 mg/dL   Creatinine, Ser 04/14/20 0.44 - 1.00 mg/dL   Calcium 9.4 8.9 - 4.27 mg/dL   Total Protein 6.8 6.5 - 8.1 g/dL   Albumin 2.5 (L) 3.5 - 5.0 g/dL   AST 20 15 - 41 U/L   ALT 17 0 - 44 U/L   Alkaline Phosphatase 85 38 - 126 U/L   Total Bilirubin <0.1 (L) 0.3 - 1.2 mg/dL   GFR calc non Af Amer >60 >60 mL/min   GFR calc Af Amer >60 >60 mL/min   Anion gap 12 5 - 15  Type and screen [facility]   Collection Time: 02/13/20 10:25 AM  Result Value Ref Range   ABO/RH(D) A POS    Antibody Screen NEG    Sample Expiration      02/16/2020,2359 Performed at [facility], [address], [place], [place]   SARS Coronavirus 2 by RT PCR (hospital order, performed in [facility] hospital lab) Nasopharyngeal Nasopharyngeal Swab   Collection Time: 02/13/20 10:43 AM   Specimen: Nasopharyngeal Swab   Result Value Ref Range   SARS Coronavirus 2 NEGATIVE NEGATIVE    Patient Active Problem List  Diagnosis Date Noted  . IUFD at 74 weeks or more of gestation 02/13/2020    Assessment/Plan:  Nirvana Blanchett is a 34 y.o. G3P2002 at [redacted]w[redacted]d here for IOL for IUFD  #Labor: patient contracting every 2 minutes with favorable cervix. Offered AROM and patient declined. Will reassess in 1-2 hours.  #Pain: epidural #ID:  Unknown #MOC: unsure  Wende Mott, CNM  02/13/2020, 12:32 PM

## 2020-02-13 NOTE — MAU Provider Note (Signed)
RN called: they were attempting to get patient on the monitor, and were unable to get FHT. I came to the room with bedside US to attempt to visualize heart tones with [place], and was unable to. Ultrasound notified and came to the bedside to perform [place]. Cleone Slim, CNM notified for labor admission.   Thressa Sheller DNP, CNM  02/13/20  9:58 AM

## 2020-02-13 NOTE — Anesthesia Procedure Notes (Signed)
Epidural Patient location during procedure: OB Start time: 02/13/2020 11:48 AM End time: 02/13/2020 12:02 PM  Staffing Anesthesiologist: Trevor Iha, MD Performed: anesthesiologist   Preanesthetic Checklist Completed: patient identified, IV checked, site marked, risks and benefits discussed, surgical consent, monitors and equipment checked, pre-op evaluation and timeout performed  Epidural Patient position: sitting Prep: DuraPrep and site prepped and draped Patient monitoring: continuous pulse ox and blood pressure Approach: midline Location: L3-L4 Injection technique: LOR air  Needle:  Needle type: Tuohy  Needle gauge: 17 G Needle length: 9 cm and 9 Needle insertion depth: 7 cm Catheter type: closed end flexible Catheter size: 19 Gauge Catheter at skin depth: 15 cm Test dose: negative  Assessment Events: blood not aspirated, injection not painful, no injection resistance, no paresthesia and negative IV test  Additional Notes Patient identified. Risks/Benefits/Options discussed with patient including but not limited to bleeding, infection, nerve damage, paralysis, failed block, incomplete pain control, headache, blood pressure changes, nausea, vomiting, reactions to medication both or allergic, itching and postpartum back pain. Confirmed with bedside nurse the patient's most recent platelet count. Confirmed with patient that they are not currently taking any anticoagulation, have any bleeding history or any family history of bleeding disorders. Patient expressed understanding and wished to proceed. All questions were answered. Sterile technique was used throughout the entire procedure. Please see nursing notes for vital signs. Test dose was given through epidural needle and negative prior to continuing to dose epidural or start infusion. Warning signs of high block given to the patient including shortness of breath, tingling/numbness in hands, complete motor block, or any  concerning symptoms with instructions to call for help. Patient was given instructions on fall risk and not to get out of bed. All questions and concerns addressed with instructions to call with any issues.  1 Attempt (S) . Patient tolerated procedure well.

## 2020-02-13 NOTE — Progress Notes (Signed)
Patient reporting intermittent urge to push. Cervix 10/100/+1. Will attempt to labor down and start pushing in 1 hour.   Rolm Bookbinder, CNM 02/13/20 4:19 PM

## 2020-02-13 NOTE — Progress Notes (Signed)
Labor Progress Note Sonya Caldwell is a 34 y.o. G3P2002 at [redacted]w[redacted]d presented for IOL for IUFD  S:  Patient comfortable for epidural  O:  BP (!) 143/85   Pulse (!) 111   Temp 97.8 F (36.6 C) (Oral)   Resp 18   Ht 5\' 2"  (1.575 m)   Wt 117.9 kg   SpO2 98% Comment: room air  BMI 47.55 kg/m   Toco: 1-3  CVE: Dilation: 6 Effacement (%): 100 Station: 0 Exam by:: [email or web address] CNM   [name]: 34 y.o. 32 [redacted]w[redacted]d IOL IUFD #Labor: Progressing well. Discussed with patient risks and benefits of AROM for augmentation of labor. Patient agreeable to plan of care. AROM with copious amount of dark brown fluid. Patient and FHR tolerated procedure well.  #Pain: epidural   [redacted]w[redacted]d, CNM 2:38 PM

## 2020-02-13 NOTE — Anesthesia Preprocedure Evaluation (Addendum)
Anesthesia Evaluation  Patient identified by MRN, date of birth, ID band Patient awake    Reviewed: Allergy & Precautions, H&P , NPO status , Patient's Chart, lab work & pertinent test results  Airway Mallampati: II  TM Distance: >3 FB Neck ROM: Full    Dental no notable dental hx. (+) Teeth Intact   Pulmonary neg pulmonary ROS,    Pulmonary exam normal breath sounds clear to auscultation       Cardiovascular hypertension, Normal cardiovascular exam Rhythm:Regular Rate:Normal     Neuro/Psych negative neurological ROS     GI/Hepatic negative GI ROS, Neg liver ROS,   Endo/Other  negative endocrine ROS  Renal/GU negative Renal ROS     Musculoskeletal   Abdominal   Peds  Hematology Lab Results      Component                Value               Date                      WBC                      11.2 (H)            02/13/2020                HGB                      13.6                02/13/2020                HCT                      41.5                02/13/2020                MCV                      97.0                02/13/2020                PLT                      285                 02/13/2020                PLT                      282                 02/13/2020             Anesthesia Other Findings   Reproductive/Obstetrics (+) Pregnancy                            Anesthesia Physical Anesthesia Plan  ASA: III  Anesthesia Plan: Epidural   Post-op Pain Management:    Induction:   PONV Risk Score and Plan:   Airway Management Planned:   Additional Equipment: None  Intra-op Plan:   Post-operative Plan:   Informed Consent: I have reviewed the patients History  and Physical, chart, labs and discussed the procedure including the risks, benefits and alternatives for the proposed anesthesia with the patient or authorized representative who has indicated his/her understanding and  acceptance.     Dental advisory given  Plan Discussed with:   Anesthesia Plan Comments: (37 wk IUFD G3p2 for LEA hx of PIH)       Anesthesia Quick Evaluation

## 2020-02-14 LAB — COMPREHENSIVE METABOLIC PANEL
ALT: 17 U/L (ref 0–44)
AST: 27 U/L (ref 15–41)
Albumin: 2.1 g/dL — ABNORMAL LOW (ref 3.5–5.0)
Alkaline Phosphatase: 83 U/L (ref 38–126)
Anion gap: 12 (ref 5–15)
BUN: 6 mg/dL (ref 6–20)
CO2: 22 mmol/L (ref 22–32)
Calcium: 8.6 mg/dL — ABNORMAL LOW (ref 8.9–10.3)
Chloride: 102 mmol/L (ref 98–111)
Creatinine, Ser: 0.73 mg/dL (ref 0.44–1.00)
GFR calc Af Amer: >60 mL/min (ref >60)
GFR calc non Af Amer: >60 mL/min (ref >60)
Glucose, Bld: 175 mg/dL — ABNORMAL HIGH (ref 70–99)
Potassium: 4 mmol/L (ref 3.5–5.1)
Sodium: 136 mmol/L (ref 135–145)
Total Bilirubin: 0.5 mg/dL (ref 0.3–1.2)
Total Protein: 6.1 g/dL — ABNORMAL LOW (ref 6.5–8.1)

## 2020-02-14 LAB — HSV 1 ANTIBODY, IGG: HSV 1 Glycoprotein G Ab, IgG: 58.3 index — ABNORMAL HIGH (ref 0.00–0.90)

## 2020-02-14 LAB — CBC
HCT: 37.9 % (ref 36.0–46.0)
Hemoglobin: 12.4 g/dL (ref 12.0–15.0)
MCH: 32.1 pg (ref 26.0–34.0)
MCHC: 32.7 g/dL (ref 30.0–36.0)
MCV: 98.2 fL (ref 80.0–100.0)
Platelets: 228 10*3/uL (ref 150–400)
RBC: 3.86 MIL/uL — ABNORMAL LOW (ref 3.87–5.11)
RDW: 13.4 % (ref 11.5–15.5)
WBC: 17.8 10*3/uL — ABNORMAL HIGH (ref 4.0–10.5)
nRBC: 0 % (ref 0.0–0.2)

## 2020-02-14 LAB — HSV 2 ANTIBODY, IGG: HSV 2 Glycoprotein G Ab, IgG: <0.91 index (ref 0.00–0.90)

## 2020-02-14 LAB — TOXOPLASMA GONDII ANTIBODY, IGG: Toxoplasma IgG Ratio: 22.7 IU/mL — ABNORMAL HIGH (ref 0.0–7.1)

## 2020-02-14 LAB — CMV ANTIBODY, IGG (EIA): CMV Ab - IgG: >10 U/mL — ABNORMAL HIGH (ref 0.00–0.59)

## 2020-02-14 MED ORDER — IBUPROFEN 600 MG PO TABS: 600.0000 mg | ORAL_TABLET | Freq: Four times a day (QID) | ORAL | 0 refills | Status: AC

## 2020-02-14 MED ORDER — ENALAPRIL MALEATE 10 MG PO TABS: 10.0000 mg | ORAL_TABLET | Freq: Every day | ORAL | 1 refills | Status: AC

## 2020-02-14 MED ORDER — ENALAPRIL MALEATE 5 MG PO TABS
10.0000 mg | ORAL_TABLET | Freq: Every day | ORAL | Status: DC
  Administered 2020-02-14: 10 mg via ORAL
  Filled 2020-02-14: qty 2

## 2020-02-14 NOTE — Progress Notes (Signed)
CSW received consult due to IUFD.  CSW available for support secondary to [employer] and will await call from Chaplain before becoming involved.  CSW screening out referral at this time.  CSW confirmed that chaplain will be seeing MOB.   Celso Sickle, LCSW Clinical Social Worker [facility] Cell#: [phone]

## 2020-02-14 NOTE — Anesthesia Postprocedure Evaluation (Signed)
Anesthesia Post Note  Patient: Sonya Caldwell  Procedure(s) Performed: AN AD HOC LABOR EPIDURAL     Patient location during evaluation: Mother Baby Anesthesia Type: Epidural Level of consciousness: awake and alert Pain management: pain level controlled Vital Signs Assessment: post-procedure vital signs reviewed and stable Respiratory status: spontaneous breathing, nonlabored ventilation and respiratory function stable Cardiovascular status: stable Postop Assessment: no headache, no backache and epidural receding Anesthetic complications: no    Last Vitals:  Vitals:   02/13/20 2300 02/14/20 0456  BP: (!) 144/84 (!) 148/93  Pulse: (!) 114 (!) 104  Resp: 18 18  Temp: 36.8 C 37.3 C  SpO2: 99% 99%    Last Pain:  Vitals:   02/14/20 0456  TempSrc: Oral  PainSc: 0-No pain   Pain Goal:                   Junious Silk

## 2020-02-14 NOTE — Progress Notes (Signed)
Attempted visit with pt to introduce spiritual care services and offer support upon the death of her baby.  Pt was sleeping when I entered the room.  Consulted with pt RN, Colin Mulders, who reports she may be discharged today, but orders are not in yet.  Will follow up later this morning.  Please page as further needs arise.  Maryanna Shape. Carley Hammed, M.Div. [facility] Chaplain Pager [phone] Office [phone]

## 2020-02-14 NOTE — Progress Notes (Signed)
Discharge teaching complete with pt and family. Medications and postpartum care discussed with pt. Pt discharged home with family.

## 2020-02-14 NOTE — Discharge Instructions (Signed)

## 2020-02-15 LAB — TORCH-IGM(TOXO/ RUB/ CMV/ HSV) W TITER
CMV IgM: <30 AU/mL (ref 0.0–29.9)
HSVI/II Comb IgM: <0.91 Ratio (ref 0.00–0.90)
Rubella IgM: <20 AU/mL (ref 0.0–19.9)
Toxoplasma Antibody- IgM: <3 AU/mL (ref 0.0–7.9)

## 2020-02-15 LAB — INFECT DISEASE AB IGM REFLEX 1

## 2020-02-15 LAB — SURGICAL PATHOLOGY

## 2020-02-17 ENCOUNTER — Encounter (HOSPITAL_COMMUNITY): Payer: Self-pay | Admitting: Obstetrics and Gynecology

## 2020-02-18 ENCOUNTER — Ambulatory Visit: Payer: Medicaid Other

## 2020-02-22 ENCOUNTER — Ambulatory Visit: Payer: Medicaid Other

## 2020-02-24 NOTE — BH Specialist Note (Addendum)
Pt did not arrive to video visit and did not answer the phone ; Left HIPPA-compliant message to call back Asher Muir from [facility] [employer] at [facility] at [phone] [facility] office); unable to leave MyChart message for pt.   [facility] via Telemedicine Video (Caregility) Visit  02/24/2020 Debera Lat [phone]  Rae Lips

## 2020-02-29 ENCOUNTER — Ambulatory Visit: Payer: Medicaid Other | Admitting: Clinical

## 2020-02-29 DIAGNOSIS — Z91199 Patient's noncompliance with other medical treatment and regimen due to unspecified reason: Secondary | ICD-10-CM

## 2020-03-09 ENCOUNTER — Encounter: Payer: Self-pay | Admitting: General Practice

## 2020-03-20 ENCOUNTER — Encounter: Payer: Self-pay | Admitting: Obstetrics and Gynecology

## 2020-03-20 ENCOUNTER — Ambulatory Visit: Payer: Medicaid Other | Admitting: Obstetrics and Gynecology

## 2022-04-19 IMAGING — US US MFM FETAL BPP W/O NON-STRESS
1 series · 12 of 24 positions shown · non-contrast
Comparison: none

[Series 1: us mfm fetal bpp w/o non-stress · 24 acquisitions, 12 frames shown]
[im 2/24]
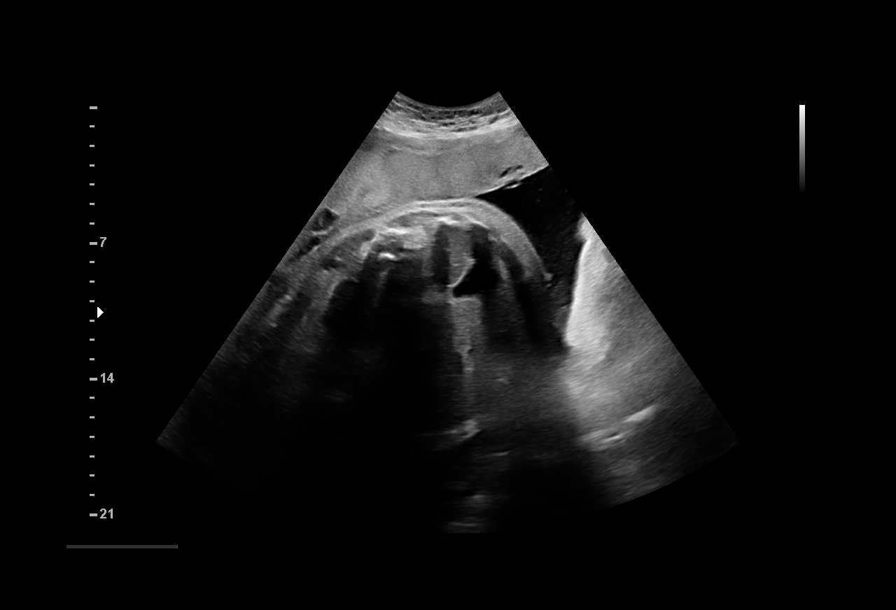
[im 4/24]
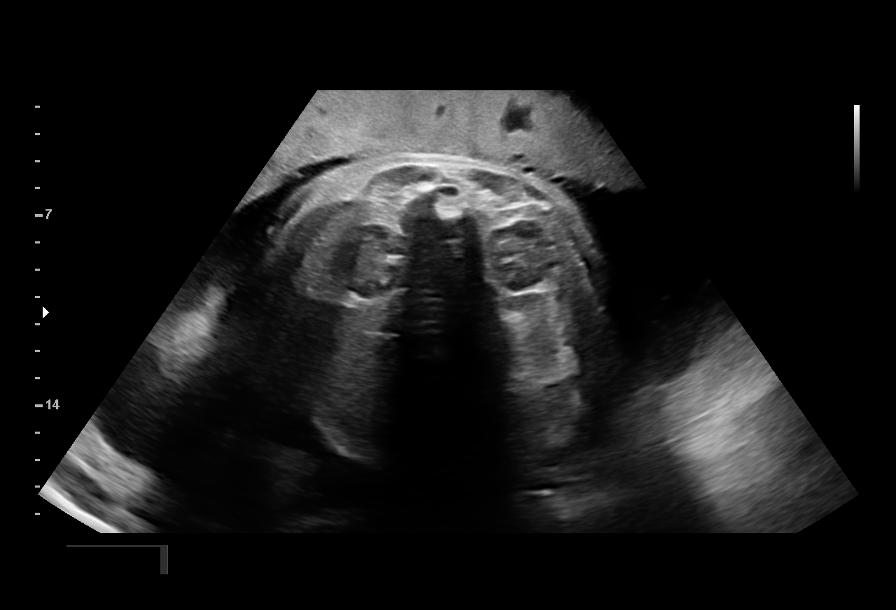
[im 6/24]
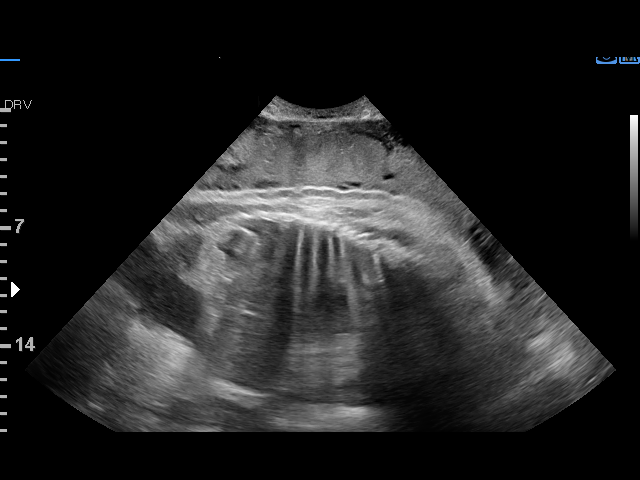
[im 8/24]
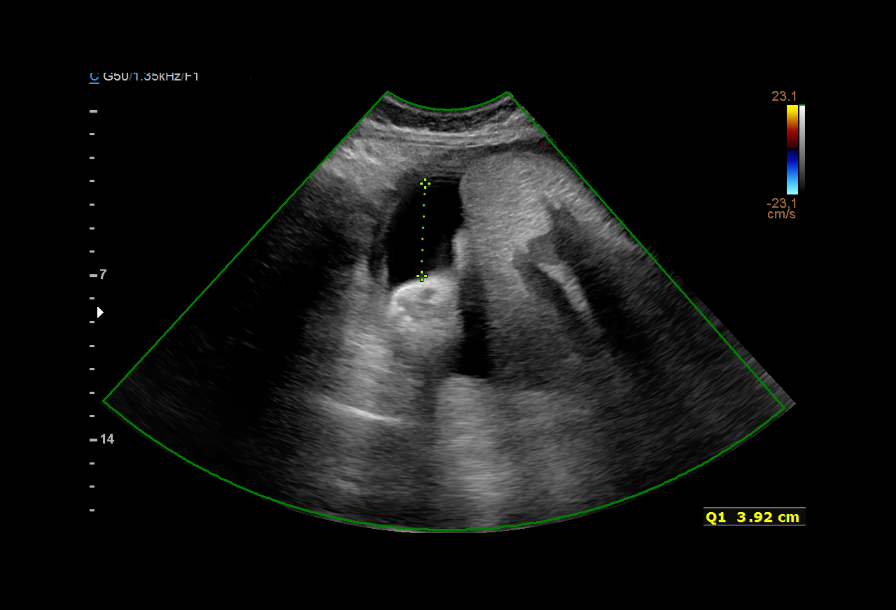
[im 10/24]
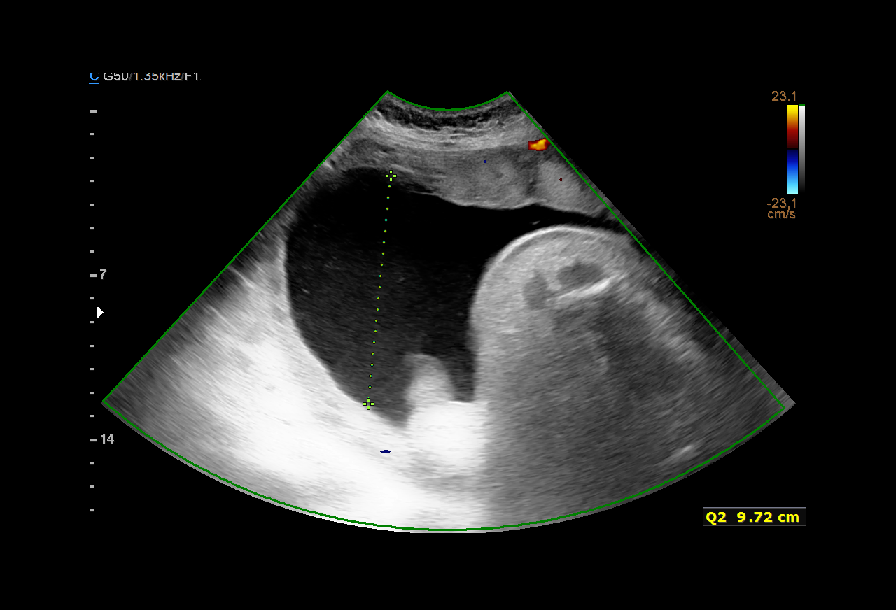
[im 12/24]
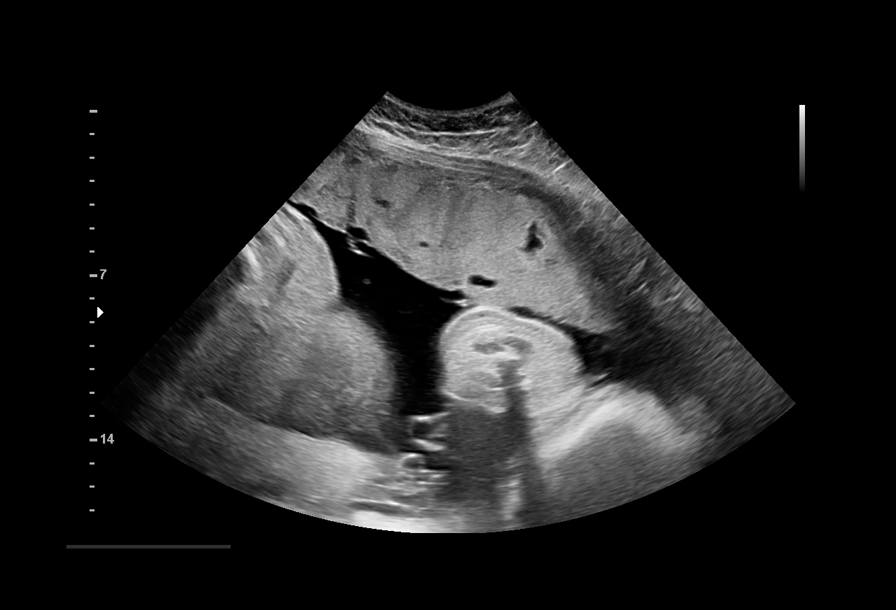
[im 14/24]
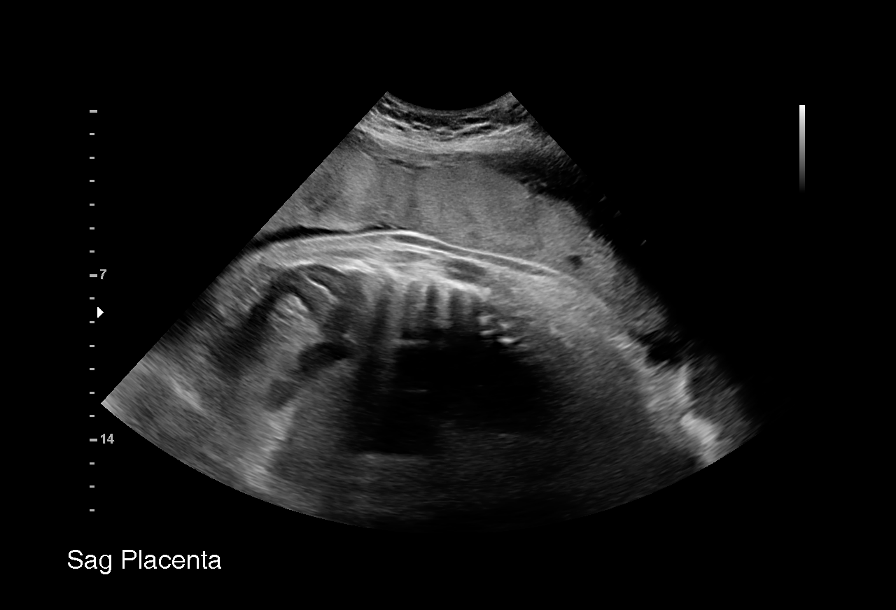
[im 16/24]
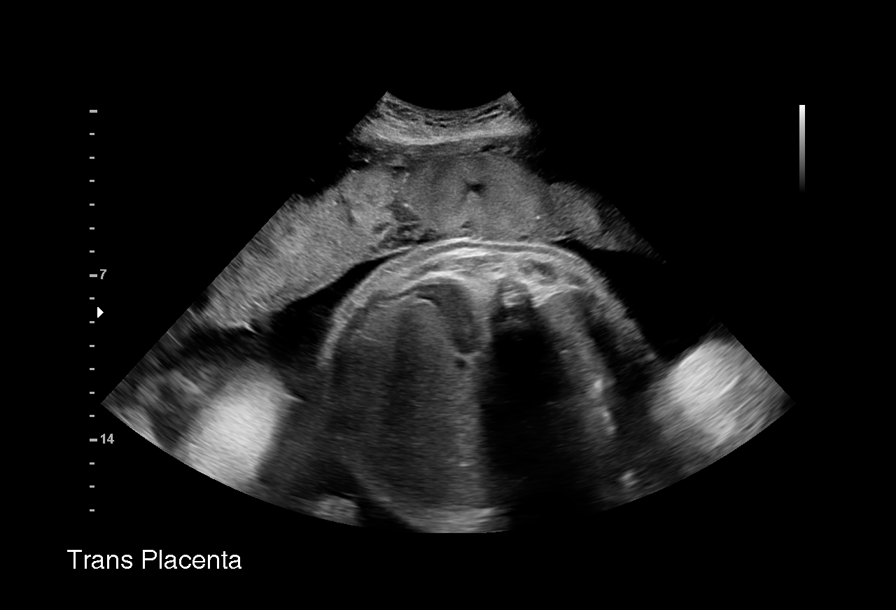
[im 18/24]
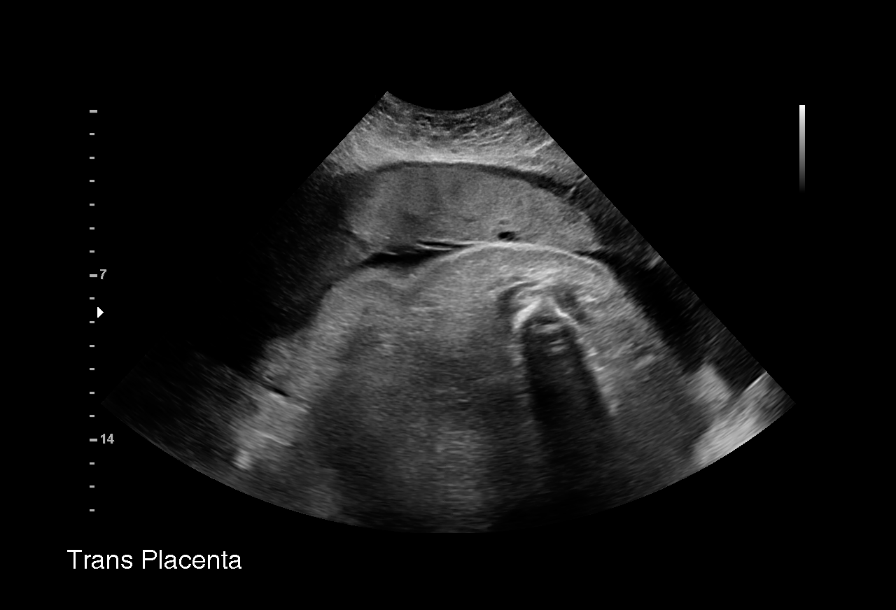
[im 20/24]
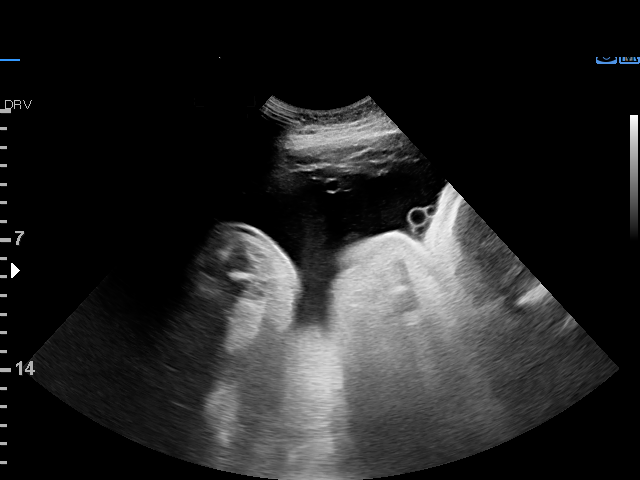
[im 22/24]
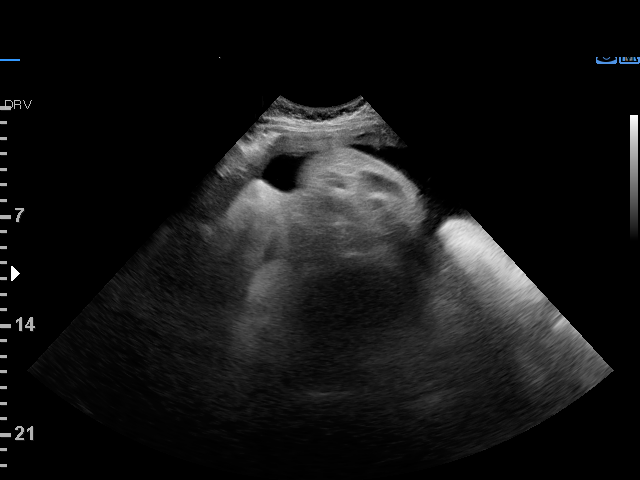
[im 24/24]
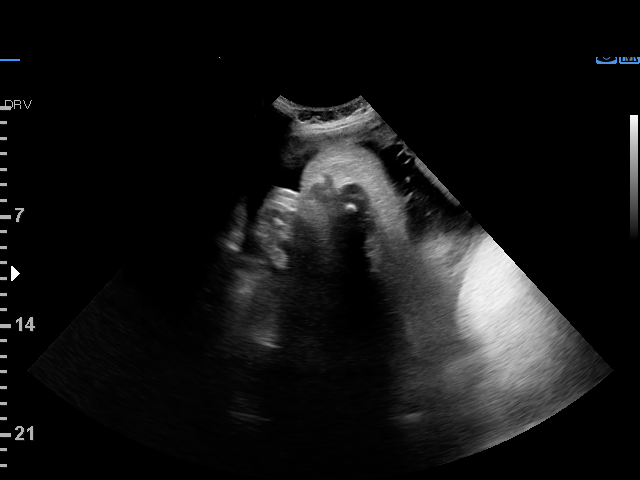

[12 of 24 positions shown; findings below may reference images not displayed]

CNM

Indications

 Non-reactive NST
 35 weeks gestation of pregnancy
 Maternal morbid obesity
Fetal Evaluation

 Num Of Fetuses:          1
 Fetal Heart Rate(bpm):   150
 Cardiac Activity:        Observed
 Presentation:            Cephalic
 Placenta:                No abruption or previa seen, Anterior

 Amniotic Fluid
 AFI FV:      Within normal limits

 AFI Sum(cm)     %Tile       Largest Pocket(cm)
 23.1            88

 RUQ(cm)       RLQ(cm)       LUQ(cm)        LLQ(cm)

Biophysical Evaluation

 Amniotic F.V:   Within normal limits       F. Tone:         Observed
 F. Movement:    Observed                   Score:           [DATE]
 F. Breathing:   Observed
OB History

 Gravidity:    3         Term:   2
 Living:       2
Gestational Age

 Clinical EDD:  35w 3d                                        EDD:   03/05/20
 Best:          35w 3d     Det. By:  Clinical EDD             EDD:   03/05/20
Cervix Uterus Adnexa

 Cervix
 Not visualized (advanced GA >48wks)
Impression

 Antenatal follow up for NR NST
 Biophysical profile [DATE]
 Good fetlal movement and amniotic fluid
 Amniotic fluid is subjectively normal with normal AFI and but
 elevated MVP.
Recommendations

 Consider reassess amniotic fluid in 1 week.

## 2022-04-30 IMAGING — US US MFM OB LIMITED
1 series · 15 of 28 positions shown · non-contrast
Comparison: none

[Series 1: us mfm ob limited · 51 acquisitions, 15 frames shown]
[im 1/51]
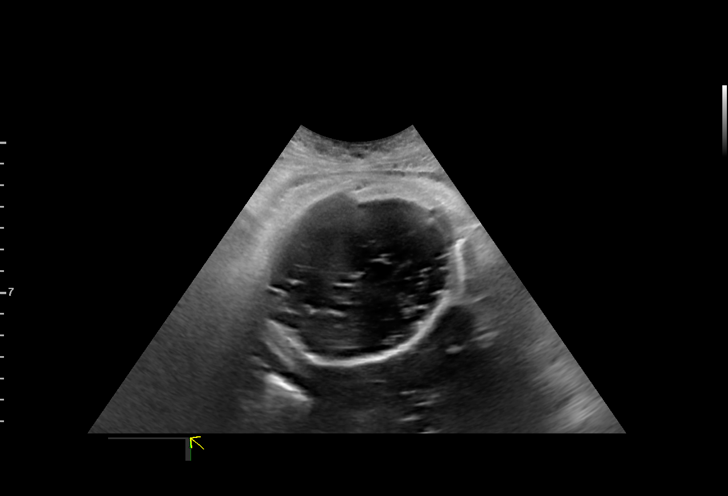
[im 4/51]
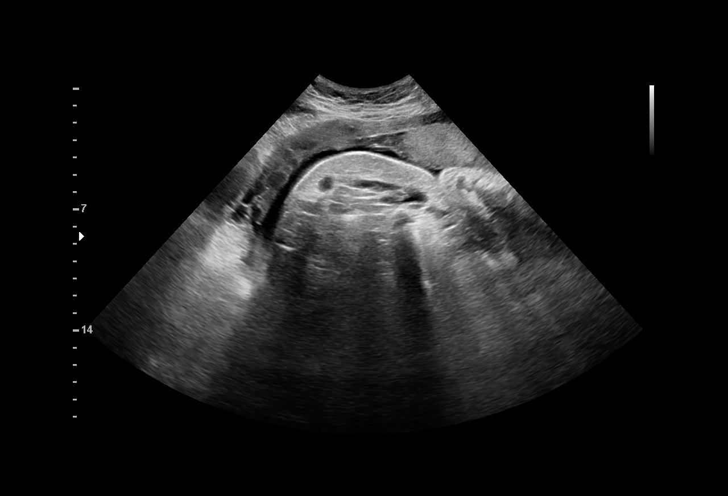
[im 8/51]
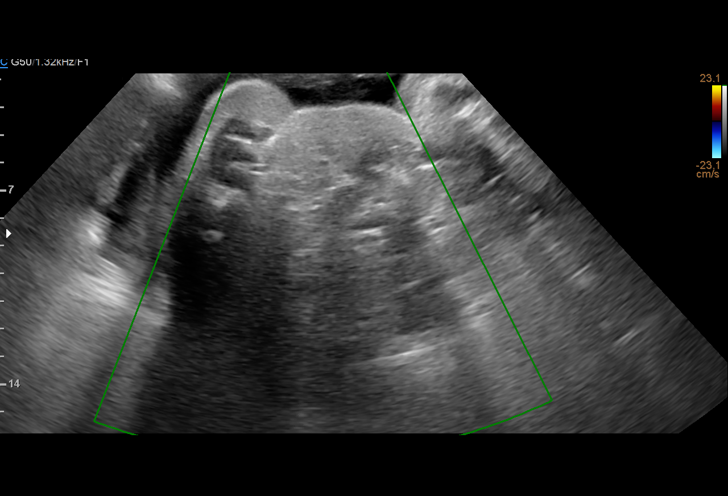
[im 12/51]
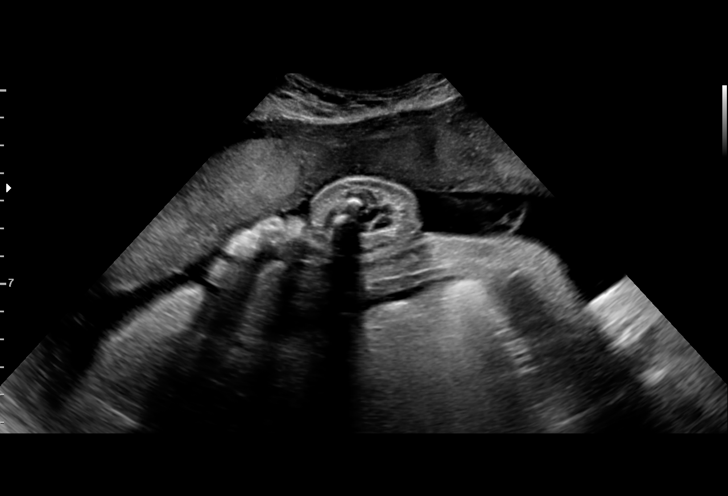
[im 15/51]
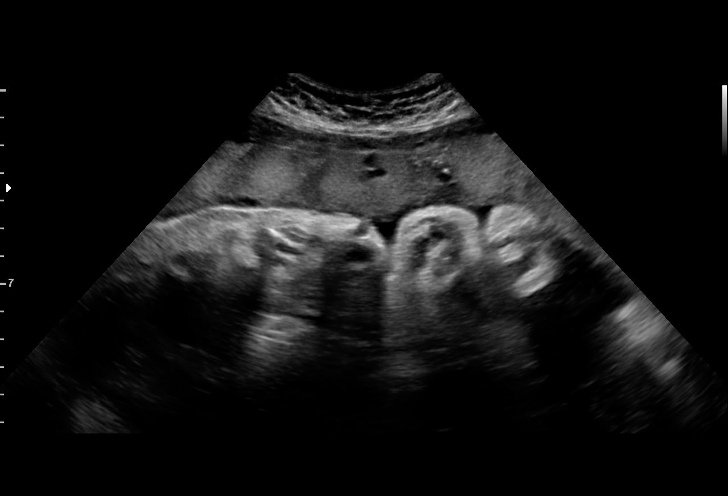
[im 19/51]
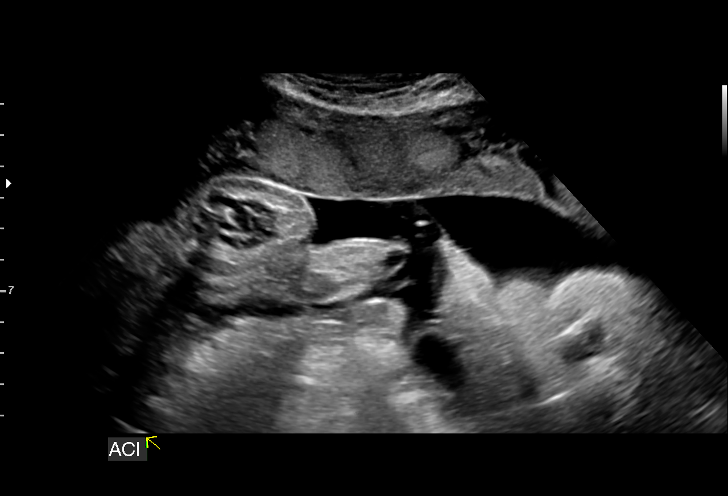
[im 23/51]
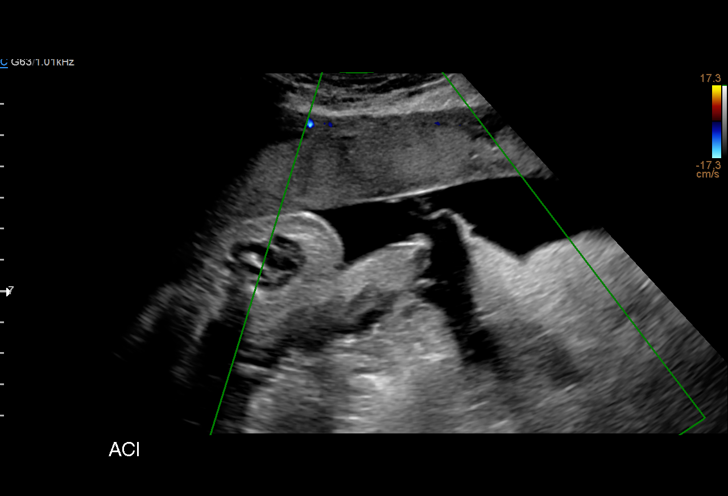
[im 26/51]
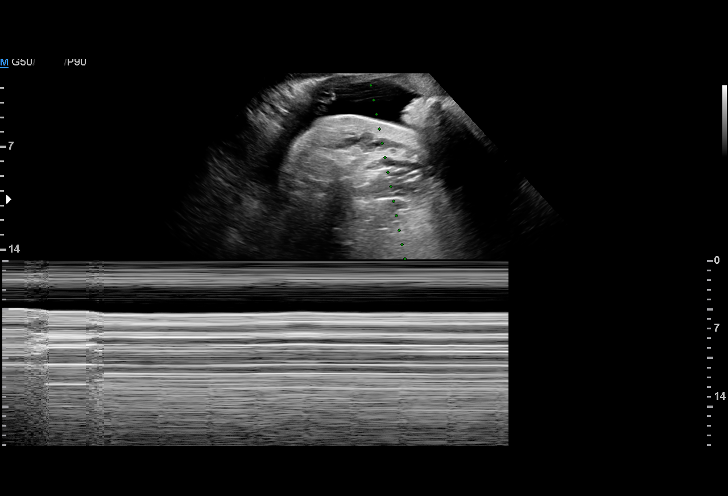
[im 28/51]
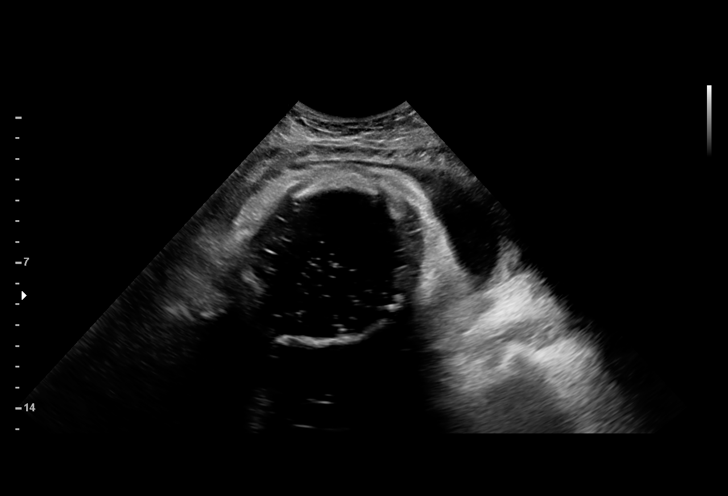
[im 32/51]
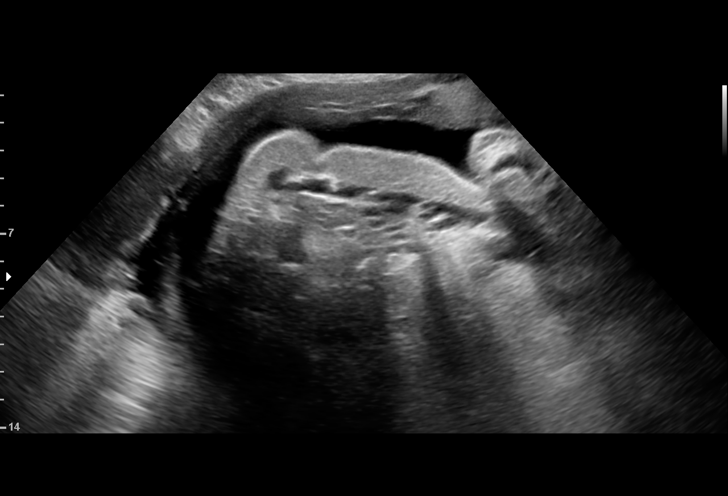
[im 36/51]
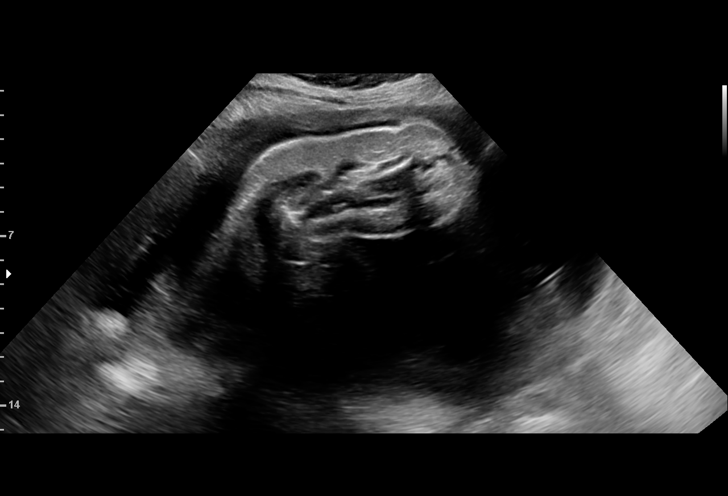
[im 39/51]
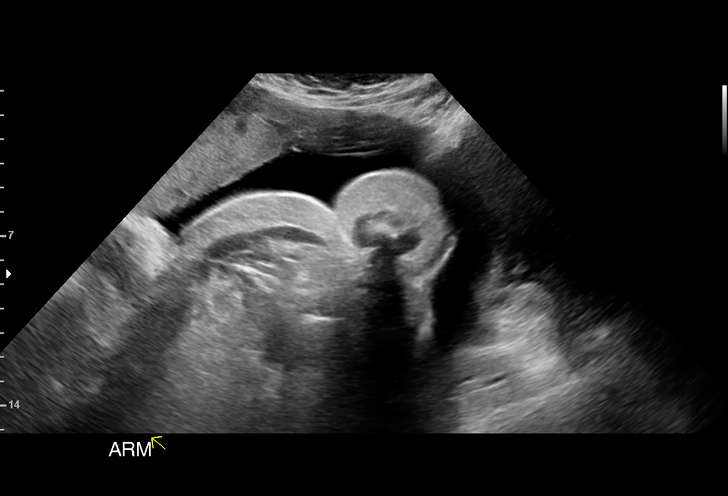
[im 43/51]
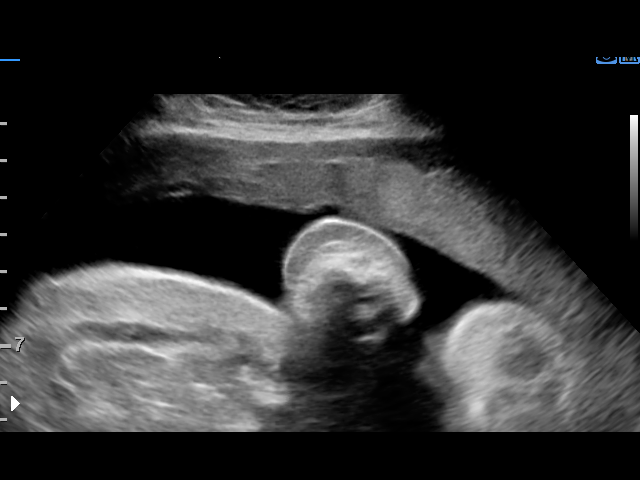
[im 47/51]
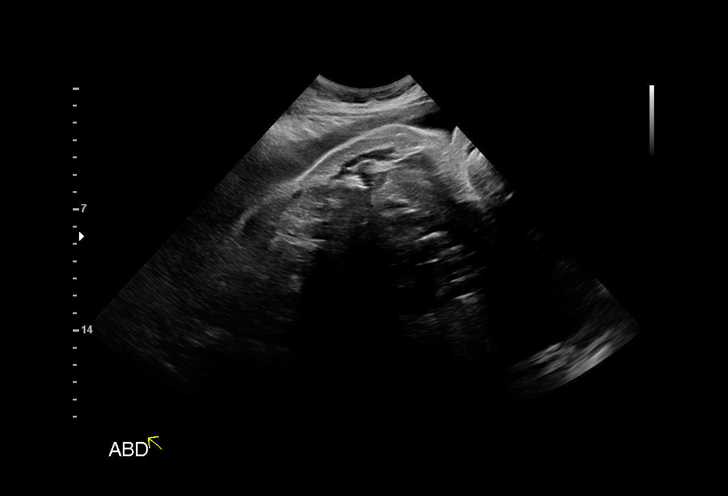
[im 51/51]
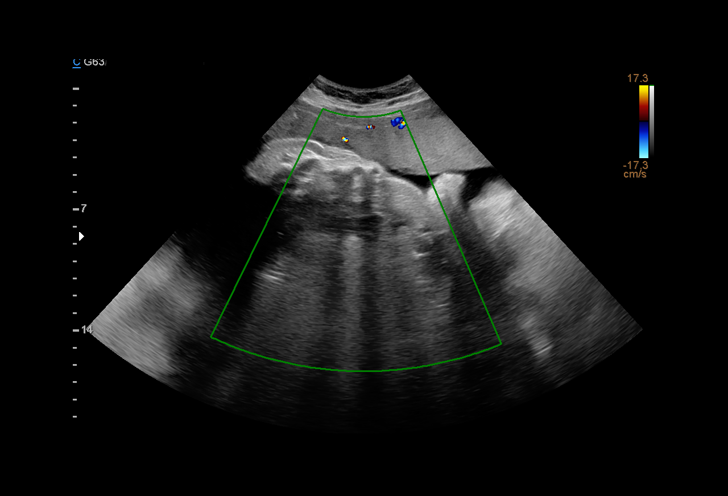

[15 of 28 positions shown; findings below may reference images not displayed]

ODDO CNM
                   CNM

 1  US MFM OB LIMITED                     76815.01    OLEKSANDR ARELLANO

Indications

 Encounter for assessment of fetal demise
 Maternal morbid obesity
 37 weeks gestation of pregnancy
Fetal Evaluation

 Num Of Fetuses:          1
 Cardiac Activity:        Not visualized
 Presentation:            Cephalic
 Placenta:                Anterior

 Amniotic Fluid
 AFI FV:      Subjectively upper-normal

                             Largest Pocket(cm)

OB History

 Gravidity:    3         Term:   2
 Living:       2
Gestational Age

 Clinical EDD:  37w 0d                                        EDD:   03/05/20
 Best:          37w 0d     Det. By:  Clinical EDD             EDD:   03/05/20
Comments

 A fetal demise was noted on today's ultrasound.

 Skin edema was noted in the fetus.
 The fetus is in the vertex presentation.

## 2024-10-06 ENCOUNTER — Ambulatory Visit (HOSPITAL_COMMUNITY): Admission: EM | Admit: 2024-10-06 | Discharge: 2024-10-06 | Disposition: A | Discharge status: Home / Self Care

## 2024-10-06 ENCOUNTER — Encounter (HOSPITAL_COMMUNITY): Payer: Self-pay

## 2024-10-06 DIAGNOSIS — K649 Unspecified hemorrhoids: Secondary | ICD-10-CM

## 2024-10-06 MED ORDER — HYDROCORTISONE ACETATE 25 MG RE SUPP: 25.0000 mg | Freq: Two times a day (BID) | RECTAL | 0 refills | Status: AC

## 2024-10-06 NOTE — ED Triage Notes (Signed)
 Patient reports pain around anus area that started x 2 days.

## 2024-10-06 NOTE — Discharge Instructions (Addendum)
 Your evaluation has revealed that your symptoms are due to hemorrhoids.  You can purchase and apply preparation H hemorrhoid cream to the rectal area 2 times a day for the next up to 7 days (OTC) and do sitz baths to soothe the area (place a few inches of warm water int he bath tub with some epsom salt and soak). Do not use preparation H cream for longer than 7 days since it is a steroid.  You have been given a prescription for Anusol  suppositories.  Place 1 suppository rectally twice a day.  Stay well hydrated and eat a high fiber diet full of fruits, vegetables, and whole grains. Purchase colace stool softener over the counter and take this once daily to soften stools and prevent straining on the toilet. Do not sit on the toilet for longer than 5 minutes at a time as this can worsen hemorrhoids.  If your symptoms do not improve in the next 2-3 days with interventions, please return here or follow-up with PCP. If you develop any new or severe symptoms, please go to the ER for further evaluation. Feel better!

## 2024-10-06 NOTE — ED Provider Notes (Signed)
 " [facility]    CSN: [id number] Arrival date & time: 10/06/24  1436      History   Chief Complaint Chief Complaint  Patient presents with   Hemorrhoids   Pain    HPI Farzana Koci is a 39 y.o. female.   This 39 year old female is being seen for complaints of anal pain.  She reports pain started 2 days ago.  She reports history of hemorrhoids dating back to 2020.  She has not taken anything for this current episode of hemorrhoidal flare.  She denies chest pain, shortness of breath.  She denies abdominal pain, nausea, vomiting.  She denies diarrhea.  She endorses constipation at times.  She denies blood in her stool or rectal bleeding.  She is establishing care with primary provider.  Her appointment is scheduled for 10/18/2024.     History reviewed. No pertinent past medical history.  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 No pertinent surgical history.  OB History     Gravida  3   Para  3   Term  3   Preterm      AB      Living  2      SAB      IAB      Ectopic      Multiple  0   Live Births  2            Home Medications    Prior to Admission medications  Medication Sig Start Date End Date Taking? Authorizing Provider  hydrocortisone  (ANUSOL -HC) 25 MG suppository Place 1 suppository (25 mg total) rectally 2 (two) times daily. 10/06/24  Yes Oriel Ojo C, FNP  enalapril  (VASOTEC ) 10 MG tablet Take 1 tablet (10 mg total) by mouth daily. 02/15/20   Constant, Peggy, MD  ibuprofen  (ADVIL ) 600 MG tablet Take 1 tablet (600 mg total) by mouth every 6 (six) hours. 02/14/20   Constant, Peggy, MD  Prenatal Vit-Fe Fumarate-FA (MULTIVITAMIN-PRENATAL) 27-0.8 MG TABS tablet Take 1 tablet by mouth daily at 12 noon.    [provider]    Family History History reviewed. No pertinent family history.  Social History Social History[1]   Allergies    Patient has no known allergies.   Review of Systems Review of Systems  Constitutional:  Negative for activity change, chills and fever.  Respiratory:  Negative for shortness of breath.   Cardiovascular:  Negative for chest pain.  Gastrointestinal:  Positive for constipation and rectal pain. Negative for abdominal pain, anal bleeding, blood in stool, diarrhea, nausea and vomiting.  Skin:  Negative for color change.     Physical Exam Triage Vital Signs ED Triage Vitals [10/06/24 1555]  Encounter Vitals Group     BP (!) 144/92     Girls Systolic BP Percentile      Girls Diastolic BP Percentile      Boys Systolic BP Percentile      Boys Diastolic BP Percentile      Pulse Rate 97     Resp 18     Temp 98.4 F (36.9 C)     Temp Source Oral     SpO2 97 %     Weight      Height      Head Circumference      Peak Flow      Pain Score 6  Pain Loc      Pain Education      Exclude from Growth Chart    No data found.  Updated Vital Signs BP (!) 144/92 (BP Location: Left Arm)   Pulse 97   Temp 98.4 F (36.9 C) (Oral)   Resp 18   LMP 08/23/2024   SpO2 97%   Visual Acuity Right Eye Distance:   Left Eye Distance:   Bilateral Distance:    Right Eye Near:   Left Eye Near:    Bilateral Near:     Physical Exam Vitals and nursing note reviewed.  Constitutional:      General: She is not in acute distress.    Appearance: She is well-developed. She is not toxic-appearing.     Comments: Pleasant female appearing stated age, sitting in chair in no acute distress.  Cardiovascular:     Rate and Rhythm: Normal rate and regular rhythm.     Heart sounds: Normal heart sounds. No murmur heard. Pulmonary:     Effort: Pulmonary effort is normal. No respiratory distress.     Breath sounds: Normal breath sounds.  Abdominal:     General: Bowel sounds are normal.     Palpations: Abdomen is soft.     Tenderness: There is no abdominal tenderness.  Genitourinary:    Comments: 1  hemorrhoid observed at the anus.  No bleeding noted. Skin:    General: Skin is warm and dry.  Neurological:     Mental Status: She is alert.  Psychiatric:        Mood and Affect: Mood normal.      UC Treatments / Results  Labs (all labs ordered are listed, but only abnormal results are displayed) Labs Reviewed - No data to display  EKG   Radiology No results found.  Procedures Procedures (including critical care time)  Medications Ordered in UC Medications - No data to display  Initial Impression / Assessment and Plan / UC Course  I have reviewed the triage vital signs and the nursing notes.  Pertinent labs & imaging results that were available during my care of the patient were reviewed by me and considered in my medical decision making (see chart for details).     Vitals and triage reviewed, patient is hemodynamically stable.  Presentation consistent with hemorrhoid.  Prescription given for Anusol  suppository.  Advised over-the-counter Preparation H cream, sitz bath.  Plan of care, follow-up care, return precautions given, no questions at this time. Final Clinical Impressions(s) / UC Diagnoses   Final diagnoses:  Hemorrhoids, unspecified hemorrhoid type     Discharge Instructions      Your evaluation has revealed that your symptoms are due to hemorrhoids.  You can purchase and apply preparation H hemorrhoid cream to the rectal area 2 times a day for the next up to 7 days (OTC) and do sitz baths to soothe the area (place a few inches of warm water int he bath tub with some epsom salt and soak). Do not use preparation H cream for longer than 7 days since it is a steroid.  You have been given a prescription for Anusol  suppositories.  Place 1 suppository rectally twice a day.  Stay well hydrated and eat a high fiber diet full of fruits, vegetables, and whole grains. Purchase colace stool softener over the counter and take this once daily to soften stools and prevent  straining on the toilet. Do not sit on the toilet for longer than 5 minutes at a time  as this can worsen hemorrhoids.  If your symptoms do not improve in the next 2-3 days with interventions, please return here or follow-up with PCP. If you develop any new or severe symptoms, please go to the ER for further evaluation. Feel better!       ED Prescriptions     Medication Sig Dispense Auth. Provider   hydrocortisone  (ANUSOL -HC) 25 MG suppository Place 1 suppository (25 mg total) rectally 2 (two) times daily. 12 suppository Keslee Harrington C, FNP      PDMP not reviewed this encounter.    [1]  Social History Tobacco Use   Smoking status: Never   Smokeless tobacco: Never  Vaping Use   Vaping status: Never Used  Substance Use Topics   Alcohol use: Never   Drug use: Never     Lennice Jon BROCKS, FNP 10/06/24 1640  "
